# Patient Record
Sex: Female | Born: 1950
Health system: Southern US, Community
[De-identification: ages and names within clinical notes are randomized; demographics above are authoritative.]

## PROBLEM LIST (undated history)

## (undated) DIAGNOSIS — M810 Age-related osteoporosis without current pathological fracture: Secondary | ICD-10-CM

## (undated) DIAGNOSIS — I2510 Atherosclerotic heart disease of native coronary artery without angina pectoris: Secondary | ICD-10-CM

## (undated) DIAGNOSIS — F32A Depression, unspecified: Secondary | ICD-10-CM

## (undated) DIAGNOSIS — H269 Unspecified cataract: Secondary | ICD-10-CM

## (undated) DIAGNOSIS — F329 Major depressive disorder, single episode, unspecified: Secondary | ICD-10-CM

## (undated) DIAGNOSIS — T7840XA Allergy, unspecified, initial encounter: Secondary | ICD-10-CM

## (undated) DIAGNOSIS — F419 Anxiety disorder, unspecified: Secondary | ICD-10-CM

## (undated) DIAGNOSIS — Z9189 Other specified personal risk factors, not elsewhere classified: Principal | ICD-10-CM

## (undated) DIAGNOSIS — Z720 Tobacco use: Secondary | ICD-10-CM

## (undated) DIAGNOSIS — I700 Atherosclerosis of aorta: Secondary | ICD-10-CM

## (undated) DIAGNOSIS — J439 Emphysema, unspecified: Secondary | ICD-10-CM

## (undated) HISTORY — DX: Allergy, unspecified, initial encounter: T78.40XA

## (undated) HISTORY — DX: Unspecified cataract: H26.9

## (undated) HISTORY — DX: Anxiety disorder, unspecified: F41.9

## (undated) HISTORY — DX: Atherosclerosis of aorta: I70.0

## (undated) HISTORY — DX: Depression, unspecified: F32.A

## (undated) HISTORY — DX: Atherosclerotic heart disease of native coronary artery without angina pectoris: I25.10

## (undated) HISTORY — DX: Major depressive disorder, single episode, unspecified: F32.9

## (undated) HISTORY — DX: Emphysema, unspecified: J43.9

## (undated) HISTORY — DX: Age-related osteoporosis without current pathological fracture: M81.0

## (undated) HISTORY — DX: Tobacco use: Z72.0

## (undated) HISTORY — DX: Other specified personal risk factors, not elsewhere classified: Z91.89

## (undated) HISTORY — PX: OVARIAN CYST REMOVAL: SHX89

## (undated) HISTORY — PX: KNEE SURGERY: SHX244

## (undated) HISTORY — PX: TONSILLECTOMY: SUR1361

---

## 2002-01-26 ENCOUNTER — Other Ambulatory Visit: Admission: RE | Admit: 2002-01-26 | Discharge: 2002-01-26 | Payer: Self-pay | Admitting: *Deleted

## 2003-12-01 ENCOUNTER — Encounter: Admission: RE | Admit: 2003-12-01 | Discharge: 2003-12-01 | Payer: Self-pay | Admitting: Orthopaedic Surgery

## 2007-12-03 LAB — HM COLONOSCOPY

## 2016-07-08 ENCOUNTER — Telehealth: Payer: Self-pay

## 2016-07-08 NOTE — Telephone Encounter (Signed)
Pre visit call completed 

## 2016-07-10 ENCOUNTER — Encounter: Payer: Self-pay | Admitting: Family Medicine

## 2016-07-10 ENCOUNTER — Ambulatory Visit (INDEPENDENT_AMBULATORY_CARE_PROVIDER_SITE_OTHER): Payer: Medicare Other | Admitting: Family Medicine

## 2016-07-10 VITALS — BP 120/68 | HR 66 | Temp 98.2°F | Ht 62.75 in | Wt 127.8 lb

## 2016-07-10 DIAGNOSIS — F419 Anxiety disorder, unspecified: Secondary | ICD-10-CM | POA: Diagnosis not present

## 2016-07-10 DIAGNOSIS — Z1239 Encounter for other screening for malignant neoplasm of breast: Secondary | ICD-10-CM

## 2016-07-10 DIAGNOSIS — F17210 Nicotine dependence, cigarettes, uncomplicated: Secondary | ICD-10-CM

## 2016-07-10 DIAGNOSIS — Z1231 Encounter for screening mammogram for malignant neoplasm of breast: Secondary | ICD-10-CM

## 2016-07-10 DIAGNOSIS — R252 Cramp and spasm: Secondary | ICD-10-CM | POA: Diagnosis not present

## 2016-07-10 MED ORDER — LORAZEPAM 1 MG PO TABS: 1.0000 mg | ORAL_TABLET | Freq: Two times a day (BID) | ORAL | 5 refills | Status: DC

## 2016-07-10 NOTE — Patient Instructions (Signed)
Bring [place] your previous labs at your earliest convenience. Continue drinking lots of water and stretching.   Give [place] 2-3 business days to get the results of your labs back.   If you do not hear anything about your chest scan (lung cancer screening) and mammogram in the next 1-2 weeks, call our office and ask for an update.  If you find you are using the Ativan more than 10 days per month, I want you to schedule an appt.

## 2016-07-10 NOTE — Progress Notes (Signed)
Pre visit review using our clinic review tool, if applicable. No additional management support is needed unless otherwise documented below in the visit note. 

## 2016-07-10 NOTE — Progress Notes (Signed)
Chief Complaint  Patient presents with  . Establish Care    pt states she is having cramping in hands, feet,and lower legs       New Patient Visit SUBJECTIVE: HPI: Sharon Schmidt is an 66 y.o.female who is being seen for establishing care.  The patient was previously seen at only her OB/GYN's office.  Tetanus 1997ish PCV- Never had Shingrix-  Never; had bad reaction  Mammogram- 2 years ago Lung cancer screening- never  Cramping Around 1 yr, worsening since Dec of 2017. Affects b/l hands, bl feet and b/l calves equally. Happens every other day on average. She does stay well hydrated. Had electrolytes and Fe checked in the past and were all normal. She does stretch daily. Pt notes she is sore afterwards. It does not affect her joints. No numbness or tingling.     Anxiety Hx of anxiety. Treated with prn Ativan. Doesn't take every day, has a hx of MR and gets palpitations and panic attacks from it. She has anxious feelings as well. She has been on Paxil in the past but gained a lot of weight on it. She does not follow with a counselor or psychologist.  No Known Allergies  Past Medical History:  Diagnosis Date  . Allergy   . Anxiety   . Depression    Past Surgical History:  Procedure Laterality Date  . CESAREAN SECTION    . KNEE SURGERY     x2  . OVARIAN CYST REMOVAL    . TONSILLECTOMY     Social History   Social History  . Marital status: Married   Social History Main Topics  . Smoking status: Current Every Day Smoker    Types: Cigarettes    Start date: 07/10/1985  . Smokeless tobacco: Never Used  . Alcohol use Yes     Comment: Social  . Drug use: No   Family History  Problem Relation Age of Onset  . Diabetes Mother   . Hypertension Father   . Macular degeneration Father   . Vision loss Daughter      Current Outpatient Prescriptions:  .  ibuprofen (ADVIL,MOTRIN) 200 MG tablet, Take 200 mg by mouth at bedtime as needed., Disp: , Rfl:  .  LORazepam (ATIVAN) 1  MG tablet, Take 1 mg by mouth 2 (two) times daily. Prescribed by Dr. Manuela Schwartz GYN , Disp: , Rfl:  .  OVER THE COUNTER MEDICATION, Multivitamin Chewable-Take 1 chewable daily., Disp: , Rfl:  .  OVER THE COUNTER MEDICATION, Antiacid-Take 1 tablet by mouth as needed., Disp: , Rfl:   No LMP recorded. Patient is postmenopausal.  ROS Neuro: Denies numbness or tingling  MSK: +cramping   OBJECTIVE: BP 120/68 (BP Location: Left Arm, Patient Position: Sitting, Cuff Size: Normal)   Pulse 66   Temp 98.2 F (36.8 C) (Oral)   Ht 5' 2.75" (1.594 m)   Wt 127 lb 12.8 oz (58 kg)   SpO2 99%   BMI 22.82 kg/m   Constitutional: -  VS reviewed -  Well developed, well nourished, appears stated age -  No apparent distress  Psychiatric: -  Oriented to person, place, and time -  Memory intact -  Affect and mood normal -  Fluent conversation, good eye contact -  Judgment and insight age appropriate  Eye: -  Conjunctivae clear, no discharge -  Pupils symmetric, round, reactive to light  ENMT: -  Ears are patent b/l without erythema or discharge. TM's are shiny and clear b/l without  evidence of effusion or infection. -  Oral mucosa without lesions, tongue and uvula midline    Tonsils not enlarged, no erythema, no exudate, trachea midline    Pharynx moist, no lesions, no erythema  Neck: -  No gross swelling, no palpable masses -  Thyroid midline, not enlarged, mobile, no palpable masses  Cardiovascular: -  RRR, no murmurs -  No LE edema  Respiratory: -  Normal respiratory effort, no accessory muscle use, no retraction -  Breath sounds equal, no wheezes, no ronchi, no crackles  Gastrointestinal: -  Bowel sounds normal -  No tenderness, no distention, no guarding, no masses  Neurological:  -  CN II - XII grossly intact -  Sensation grossly intact to light touch, equal bilaterally  Musculoskeletal: -  No clubbing, no cyanosis -  Gait normal -  5/5 strength throughout -  +TTP over musculature in web  space between 1st and 2nd MC's b/l -  +b/l calf TTP, states this normally isn't present and she had a cramping episode this AM -  +TTP over the foot musculature on the plantar surface  Skin: -  No significant lesion on inspection -  Warm and dry to palpation   ASSESSMENT/PLAN: Cramping of feet - Plan: CBC, IBC panel, Ferritin, Comprehensive metabolic panel, Magnesium, Vitamin D (25 hydroxy), CANCELED: Comprehensive metabolic panel, CANCELED: Magnesium, CANCELED: HgB A1c  Cramping of hands - Plan: CBC, IBC panel, Ferritin, Comprehensive metabolic panel, Magnesium, Vitamin D (25 hydroxy), CANCELED: Comprehensive metabolic panel, CANCELED: Magnesium, CANCELED: HgB A1c  Screening for breast cancer - Plan: MM DIGITAL SCREENING BILATERAL  Smoking greater than 30 pack years - Plan: CT CHEST LUNG CANCER SCREENING LOW DOSE WO CONTRAST  Patient instructed to sign release of records form from her previous PCP. Tetanus today. Will come back for PCV and think about Shingrix.  Counseled on adequate hydration and continued stretching of hands/legs. Will recheck labs. If no improvement, will inform pt over phone and suggest Vit E 800 IU daily or Vit B complex. If this is unhelpful, will trial diltiazem vs gabapentin. Stated I do not ideally want her on just Ativan. She had a bad experience with Paxil in the past with weight gain. Would consider Prozac in the future. If she is using >10 times per mo, will have her return to discuss this option. Patient should return in 6 mo pending above. The patient voiced understanding and agreement to the plan.  Greater than 45 minutes were spent face to face with the patient with greater than 50% of this time spent counseling on preventative health services, immunizations, smoking cessation, cramping dx and workup.    [place], DO 07/10/16  2:39 PM

## 2016-07-11 LAB — COMPREHENSIVE METABOLIC PANEL
ALBUMIN: 4.3 g/dL (ref 3.5–5.2)
ALK PHOS: 52 U/L (ref 39–117)
ALT: 14 U/L (ref 0–35)
AST: 22 U/L (ref 0–37)
BILIRUBIN TOTAL: 0.4 mg/dL (ref 0.2–1.2)
BUN: 6 mg/dL (ref 6–23)
CO2: 26 mEq/L (ref 19–32)
Calcium: 9.1 mg/dL (ref 8.4–10.5)
Chloride: 102 mEq/L (ref 96–112)
Creatinine, Ser: 0.75 mg/dL (ref 0.40–1.20)
GFR: 82.2 mL/min (ref >60.00)
GLUCOSE: 80 mg/dL (ref 70–99)
POTASSIUM: 3.8 meq/L (ref 3.5–5.1)
Sodium: 135 mEq/L (ref 135–145)
TOTAL PROTEIN: 6.6 g/dL (ref 6.0–8.3)

## 2016-07-11 LAB — IBC PANEL
IRON: 102 ug/dL (ref 42–145)
Saturation Ratios: 34.9 % (ref 20.0–50.0)
Transferrin: 209 mg/dL — ABNORMAL LOW (ref 212.0–360.0)

## 2016-07-11 LAB — MAGNESIUM: MAGNESIUM: 2.2 mg/dL (ref 1.5–2.5)

## 2016-07-11 LAB — CBC
HEMATOCRIT: 38.8 % (ref 36.0–46.0)
HEMOGLOBIN: 13.2 g/dL (ref 12.0–15.0)
MCHC: 34.1 g/dL (ref 30.0–36.0)
MCV: 92.4 fl (ref 78.0–100.0)
Platelets: 326 10*3/uL (ref 150.0–400.0)
RBC: 4.2 Mil/uL (ref 3.87–5.11)
RDW: 14.4 % (ref 11.5–15.5)
WBC: 9.2 10*3/uL (ref 4.0–10.5)

## 2016-07-11 LAB — FERRITIN: FERRITIN: 135.8 ng/mL (ref 10.0–291.0)

## 2016-07-11 LAB — VITAMIN D 25 HYDROXY (VIT D DEFICIENCY, FRACTURES): VITD: 29.74 ng/mL — ABNORMAL LOW (ref 30.00–100.00)

## 2016-07-11 LAB — TSH: TSH: 1.5 u[IU]/mL (ref 0.35–4.50)

## 2016-07-15 ENCOUNTER — Ambulatory Visit (HOSPITAL_BASED_OUTPATIENT_CLINIC_OR_DEPARTMENT_OTHER)
Admission: RE | Admit: 2016-07-15 | Discharge: 2016-07-15 | Disposition: A | Discharge status: Home / Self Care | Payer: Medicare Other | Source: Ambulatory Visit | Attending: Family Medicine | Admitting: Family Medicine

## 2016-07-15 ENCOUNTER — Encounter (HOSPITAL_BASED_OUTPATIENT_CLINIC_OR_DEPARTMENT_OTHER): Payer: Self-pay

## 2016-07-15 ENCOUNTER — Other Ambulatory Visit: Payer: Self-pay | Admitting: Family Medicine

## 2016-07-15 ENCOUNTER — Telehealth: Payer: Self-pay | Admitting: *Deleted

## 2016-07-15 DIAGNOSIS — I2510 Atherosclerotic heart disease of native coronary artery without angina pectoris: Secondary | ICD-10-CM | POA: Diagnosis not present

## 2016-07-15 DIAGNOSIS — I700 Atherosclerosis of aorta: Secondary | ICD-10-CM | POA: Diagnosis not present

## 2016-07-15 DIAGNOSIS — J432 Centrilobular emphysema: Secondary | ICD-10-CM | POA: Insufficient documentation

## 2016-07-15 DIAGNOSIS — Z1231 Encounter for screening mammogram for malignant neoplasm of breast: Secondary | ICD-10-CM | POA: Insufficient documentation

## 2016-07-15 DIAGNOSIS — Z87891 Personal history of nicotine dependence: Secondary | ICD-10-CM | POA: Insufficient documentation

## 2016-07-15 DIAGNOSIS — F17210 Nicotine dependence, cigarettes, uncomplicated: Secondary | ICD-10-CM

## 2016-07-15 DIAGNOSIS — R918 Other nonspecific abnormal finding of lung field: Secondary | ICD-10-CM | POA: Insufficient documentation

## 2016-07-15 DIAGNOSIS — Z1239 Encounter for other screening for malignant neoplasm of breast: Secondary | ICD-10-CM

## 2016-07-15 DIAGNOSIS — I708 Atherosclerosis of other arteries: Secondary | ICD-10-CM

## 2016-07-15 DIAGNOSIS — I7090 Unspecified atherosclerosis: Secondary | ICD-10-CM

## 2016-07-15 NOTE — Telephone Encounter (Signed)
Called and spoke with the pt and informed her of recent CT chest results and note.  Pt verbalized understanding and agreed.  Pt was scheduled a lab appt for (Tues.07/16/16 @ 9:45am).  Future lab ordered and sent.//AB/CMA

## 2016-07-15 NOTE — Telephone Encounter (Signed)
-----   Message from Shelda Pal, DO sent at 07/15/2016  1:55 PM EDT ----- Let pt know that her lung CT showed nothing alarming and we will repeat in 1 year per guidelines. It did show some disease of her heart vessels. Let's add a lipid panel please. Please schedule also and ensure she is fasting. TY.

## 2016-07-16 ENCOUNTER — Other Ambulatory Visit (INDEPENDENT_AMBULATORY_CARE_PROVIDER_SITE_OTHER): Payer: Medicare Other

## 2016-07-16 DIAGNOSIS — I708 Atherosclerosis of other arteries: Secondary | ICD-10-CM

## 2016-07-16 DIAGNOSIS — I7090 Unspecified atherosclerosis: Secondary | ICD-10-CM

## 2016-07-16 LAB — LIPID PANEL
CHOLESTEROL: 202 mg/dL — AB (ref 0–200)
HDL: 87.1 mg/dL (ref >39.00)
LDL Cholesterol: 96 mg/dL (ref 0–99)
NonHDL: 115.33
TRIGLYCERIDES: 95 mg/dL (ref 0.0–149.0)
Total CHOL/HDL Ratio: 2
VLDL: 19 mg/dL (ref 0.0–40.0)

## 2016-08-05 ENCOUNTER — Ambulatory Visit: Payer: Medicare Other | Admitting: Family Medicine

## 2016-08-08 ENCOUNTER — Encounter: Payer: Self-pay | Admitting: Family Medicine

## 2016-08-08 ENCOUNTER — Ambulatory Visit (INDEPENDENT_AMBULATORY_CARE_PROVIDER_SITE_OTHER): Payer: Medicare Other | Admitting: Family Medicine

## 2016-08-08 VITALS — BP 128/74 | HR 65 | Temp 98.1°F | Ht 62.75 in | Wt 130.4 lb

## 2016-08-08 DIAGNOSIS — L821 Other seborrheic keratosis: Secondary | ICD-10-CM | POA: Diagnosis not present

## 2016-08-08 DIAGNOSIS — D489 Neoplasm of uncertain behavior, unspecified: Secondary | ICD-10-CM

## 2016-08-08 DIAGNOSIS — Z9189 Other specified personal risk factors, not elsewhere classified: Secondary | ICD-10-CM

## 2016-08-08 DIAGNOSIS — I2510 Atherosclerotic heart disease of native coronary artery without angina pectoris: Secondary | ICD-10-CM | POA: Diagnosis not present

## 2016-08-08 DIAGNOSIS — Z720 Tobacco use: Secondary | ICD-10-CM

## 2016-08-08 HISTORY — DX: Tobacco use: Z72.0

## 2016-08-08 HISTORY — DX: Atherosclerotic heart disease of native coronary artery without angina pectoris: I25.10

## 2016-08-08 HISTORY — DX: Other specified personal risk factors, not elsewhere classified: Z91.89

## 2016-08-08 MED ORDER — PRAVASTATIN SODIUM 10 MG PO TABS: 10.0000 mg | ORAL_TABLET | Freq: Every day | ORAL | 5 refills | Status: DC

## 2016-08-08 NOTE — Patient Instructions (Signed)
Stay well hydrated.  Do not shower for the rest of the day. When you do wash it, use only soap and water. Do not vigorously scrub. Apply triple antibiotic ointment (like Neosporin) twice daily. Keep the area clean and dry.   Things to look out for: increasing pain not relieved by ibuprofen/acetaminophen, fevers, spreading redness, drainage of pus, or foul odor.

## 2016-08-08 NOTE — Progress Notes (Signed)
Chief Complaint  Patient presents with  . Follow-up    to dicuss recent lab results and chest scan  . Nevus    on (R) lower leg-changing    Subjective: Patient is a 66 y.o. female here for f/u CT/cholesterol.  Atherosclerosis seen in coronary vessels and aorta. 10 years CVD is 7.5%. She has cut down on her smoking, however is still active. She is cutting down on carbs as well. She is trying to stay physically active as well.  The patient has also noticed a lesion on her leg that is getting bigger and borders are becoming more irregular. No pain or injury. It does not itch. No noticeable skin color changes.  ROS: Heart: Denies chest pain  Lungs: Denies SOB   Family History  Problem Relation Age of Onset  . Diabetes Mother   . Hypertension Father   . Macular degeneration Father   . Vision loss Daughter    Past Medical History:  Diagnosis Date  . 10 year risk of MI or stroke 7.5% or greater 08/08/2016  . Allergy   . Anxiety   . Coronary atherosclerosis 08/08/2016  . Depression   . Tobacco abuse 08/08/2016   No Known Allergies  Current Outpatient Prescriptions:  .  Cholecalciferol (VITAMIN D PO), Take by mouth. Take 1 gel cap by mouth daily., Disp: , Rfl:  .  ibuprofen (ADVIL,MOTRIN) 200 MG tablet, Take 200 mg by mouth at bedtime as needed., Disp: , Rfl:  .  LORazepam (ATIVAN) 1 MG tablet, Take 1 tablet (1 mg total) by mouth 2 (two) times daily. Prescribed by Dr. Manuela Schwartz GYN, Disp: 60 tablet, Rfl: 5 .  OVER THE COUNTER MEDICATION, Multivitamin Chewable-Take 1 chewable daily., Disp: , Rfl:  .  OVER THE COUNTER MEDICATION, Antiacid-Take 1 tablet by mouth as needed., Disp: , Rfl:  .  vitamin E 400 UNIT capsule, Take 800 Units by mouth daily., Disp: , Rfl:  .  pravastatin (PRAVACHOL) 10 MG tablet, Take 1 tablet (10 mg total) by mouth daily., Disp: 30 tablet, Rfl: 5  Objective: BP 128/74 (BP Location: Left Arm, Patient Position: Sitting, Cuff Size: Normal)   Pulse 65   Temp 98.1 F  (36.7 C) (Oral)   Ht 5' 2.75" (1.594 m)   Wt 130 lb 6.4 oz (59.1 kg)   SpO2 98%   BMI 23.28 kg/m  General: Awake, appears stated age HEENT: MMM, EOMi Heart: RRR, no murmurs Lungs: CTAB, no rales, wheezes or rhonchi. No accessory muscle use Skin: 0.8 cmx0.9 cm hyperpigmented and raised lesion on R anterior LE, no erythema, TTP, or fluctuance Psych: Age appropriate judgment and insight, normal affect and mood  Procedure note; shave biopsy Informed consent was obtained. The area was cleaned with alcohol and injected with 1.5 mL of 1% lidocaine with epinephrine. A Dermablade was slightly bent and used to cut under the area of interest. The specimen was placed in a sterile specimen cup and sent to the lab. Adequate hemostasis attained. The area was dressed with triple antibiotic ointment and a bandage. There were no complications noted. The patient tolerated the procedure well.   Assessment and Plan: 10 year risk of MI or stroke 7.5% or greater - Plan: pravastatin (PRAVACHOL) 10 MG tablet  Atherosclerosis of coronary artery of native heart without angina pectoris, unspecified vessel or lesion type - Plan: pravastatin (PRAVACHOL) 10 MG tablet  Tobacco abuse  Neoplasm of uncertain behavior - Plan: PR SHAV SKIN LES 0.6-1.0 CM TRUNK,ARM,LEG, Surgical pathology  Orders as above. Start statin. Low dose, counseled on diet and exercise. No need for ASA. Counseled on tobacco cessation. Removed lesion of interest given border irreg and change in size. F/u in 3 mo to recheck cholesterol. The patient voiced understanding and agreement to the plan.  [place], DO 08/08/16  12:44 PM

## 2016-11-08 ENCOUNTER — Encounter: Payer: Self-pay | Admitting: Family Medicine

## 2016-11-08 ENCOUNTER — Ambulatory Visit: Payer: Medicare Other | Admitting: Family Medicine

## 2016-11-08 ENCOUNTER — Ambulatory Visit (INDEPENDENT_AMBULATORY_CARE_PROVIDER_SITE_OTHER): Payer: Medicare Other | Admitting: Family Medicine

## 2016-11-08 VITALS — BP 122/84 | HR 62 | Temp 98.4°F | Ht 63.0 in | Wt 130.1 lb

## 2016-11-08 DIAGNOSIS — E559 Vitamin D deficiency, unspecified: Secondary | ICD-10-CM | POA: Diagnosis not present

## 2016-11-08 DIAGNOSIS — Z9189 Other specified personal risk factors, not elsewhere classified: Secondary | ICD-10-CM | POA: Diagnosis not present

## 2016-11-08 DIAGNOSIS — Z23 Encounter for immunization: Secondary | ICD-10-CM

## 2016-11-08 DIAGNOSIS — F419 Anxiety disorder, unspecified: Secondary | ICD-10-CM

## 2016-11-08 DIAGNOSIS — Z720 Tobacco use: Secondary | ICD-10-CM

## 2016-11-08 LAB — VITAMIN D 25 HYDROXY (VIT D DEFICIENCY, FRACTURES): VITD: 42.37 ng/mL (ref 30.00–100.00)

## 2016-11-08 MED ORDER — CLONAZEPAM 1 MG PO TABS: 0.5000 mg | ORAL_TABLET | Freq: Two times a day (BID) | ORAL | 1 refills | Status: DC

## 2016-11-08 NOTE — Patient Instructions (Addendum)
Try CoQ10.  Try to use the new medicine only as needed.   Keep up the great work when your smoking cessation efforts.  Let us know if you need anything.

## 2016-11-08 NOTE — Addendum Note (Signed)
Addended by: Mirca Seller B on: 11/08/2016 10:22 AM   Modules accepted: Orders

## 2016-11-08 NOTE — Progress Notes (Signed)
Chief Complaint  Patient presents with  . Follow-up    Subjective: Dyslipidemia Patient presents for dyslipidemia follow up. Started on pravastatin She confirms myalgias. She is adhering to a low sodium and low fat diet. The patient exercises never. She has cut down to 10 cigarettes daily from 15.   We discussed anxiety as well. She continues to use Ativan daily. She has been on Paxil in the past and had a port spirits with her as it made her gain a lot of weight. She does not follow a counselor or psychologist. She is willing to come off of this medicine.  ROS: Heart: Denies chest pain Lungs: Denies SOB  Family History  Problem Relation Age of Onset  . Diabetes Mother   . Hypertension Father   . Macular degeneration Father   . Vision loss Daughter    Past Medical History:  Diagnosis Date  . 10 year risk of MI or stroke 7.5% or greater 08/08/2016  . Allergy   . Anxiety   . Coronary atherosclerosis 08/08/2016  . Depression   . Tobacco abuse 08/08/2016   No Known Allergies  Current Outpatient Prescriptions:  .  Cholecalciferol (VITAMIN D PO), Take by mouth. Take 1 gel cap by mouth daily., Disp: , Rfl:  .  ibuprofen (ADVIL,MOTRIN) 200 MG tablet, Take 200 mg by mouth at bedtime as needed., Disp: , Rfl:  .  OVER THE COUNTER MEDICATION, Multivitamin Chewable-Take 1 chewable daily., Disp: , Rfl:  .  OVER THE COUNTER MEDICATION, Antiacid-Take 1 tablet by mouth as needed., Disp: , Rfl:  .  vitamin E 400 UNIT capsule, Take 800 Units by mouth daily., Disp: , Rfl:  .  clonazePAM (KLONOPIN) 1 MG tablet, Take 0.5-1 tablets (0.5-1 mg total) by mouth 2 (two) times daily., Disp: 60 tablet, Rfl: 1  Objective: BP 122/84 (BP Location: Left Arm, Patient Position: Sitting, Cuff Size: Normal)   Pulse 62   Temp 98.4 F (36.9 C) (Oral)   Ht 5\' 3"  (1.6 m)   Wt 130 lb 2 oz (59 kg)   SpO2 97%   BMI 23.05 kg/m  General: Awake, appears stated age HEENT: MMM, EOMi  Heart: RRR, no bruits Lungs:  CTAB, no rales, wheezes or rhonchi. No accessory muscle use Psych: Age appropriate judgment and insight, normal affect and mood  Assessment and Plan: 10 year risk of MI or stroke 7.5% or greater  Tobacco abuse  Anxiety - Plan: clonazePAM (KLONOPIN) 1 MG tablet  Vitamin D insufficiency - Plan: Vitamin D (25 hydroxy)  Orders as above. She was praised for tobacco cessation efforts. Discussed starting new medication vs lifestyle changes and smoking cessation (smoking cessation would put her below 7.5%). Opted for the latter given intolerance.  Counseled on diet and exercise. Stop Ativan. Start Klonopin, 0.5-1 tabs twice daily as needed. I will see her again in the middle of October. She will get her flu shot at that time. If she is still using this frequently, I will discuss starting a daily medication. I am pleased that she is willing to wean down. The patient voiced understanding and agreement to the plan.  [place], DO 11/08/16  10:08 AM

## 2016-11-11 ENCOUNTER — Telehealth: Payer: Self-pay

## 2016-11-11 NOTE — Telephone Encounter (Signed)
PA approved effective from 11/11/2016 through 11/11/2017.

## 2016-11-11 NOTE — Telephone Encounter (Signed)
PA initiated via Covermymeds; KEY: [id number]. Awaiting determination.

## 2016-12-17 ENCOUNTER — Emergency Department (HOSPITAL_BASED_OUTPATIENT_CLINIC_OR_DEPARTMENT_OTHER): Payer: Medicare Other

## 2016-12-17 ENCOUNTER — Encounter (HOSPITAL_BASED_OUTPATIENT_CLINIC_OR_DEPARTMENT_OTHER): Payer: Self-pay | Admitting: Emergency Medicine

## 2016-12-17 ENCOUNTER — Emergency Department (HOSPITAL_BASED_OUTPATIENT_CLINIC_OR_DEPARTMENT_OTHER)
Admission: EM | Admit: 2016-12-17 | Discharge: 2016-12-17 | Disposition: A | Discharge status: Home / Self Care | Payer: Medicare Other | Attending: Emergency Medicine | Admitting: Emergency Medicine

## 2016-12-17 DIAGNOSIS — Y92093 Driveway of other non-institutional residence as the place of occurrence of the external cause: Secondary | ICD-10-CM | POA: Insufficient documentation

## 2016-12-17 DIAGNOSIS — Y9389 Activity, other specified: Secondary | ICD-10-CM | POA: Diagnosis not present

## 2016-12-17 DIAGNOSIS — S66912A Strain of unspecified muscle, fascia and tendon at wrist and hand level, left hand, initial encounter: Secondary | ICD-10-CM

## 2016-12-17 DIAGNOSIS — Y998 Other external cause status: Secondary | ICD-10-CM | POA: Insufficient documentation

## 2016-12-17 DIAGNOSIS — Z79899 Other long term (current) drug therapy: Secondary | ICD-10-CM | POA: Insufficient documentation

## 2016-12-17 DIAGNOSIS — I2510 Atherosclerotic heart disease of native coronary artery without angina pectoris: Secondary | ICD-10-CM | POA: Insufficient documentation

## 2016-12-17 DIAGNOSIS — F329 Major depressive disorder, single episode, unspecified: Secondary | ICD-10-CM | POA: Diagnosis not present

## 2016-12-17 DIAGNOSIS — F419 Anxiety disorder, unspecified: Secondary | ICD-10-CM | POA: Diagnosis not present

## 2016-12-17 DIAGNOSIS — W010XXA Fall on same level from slipping, tripping and stumbling without subsequent striking against object, initial encounter: Secondary | ICD-10-CM | POA: Diagnosis not present

## 2016-12-17 DIAGNOSIS — S6992XA Unspecified injury of left wrist, hand and finger(s), initial encounter: Secondary | ICD-10-CM | POA: Diagnosis present

## 2016-12-17 DIAGNOSIS — F17210 Nicotine dependence, cigarettes, uncomplicated: Secondary | ICD-10-CM | POA: Insufficient documentation

## 2016-12-17 MED ORDER — IBUPROFEN 800 MG PO TABS
800.0000 mg | ORAL_TABLET | Freq: Once | ORAL | Status: AC
  Administered 2016-12-17: 800 mg via ORAL
  Filled 2016-12-17: qty 1

## 2016-12-17 MED ORDER — ACETAMINOPHEN 500 MG PO TABS
1000.0000 mg | ORAL_TABLET | Freq: Once | ORAL | Status: AC
  Administered 2016-12-17: 1000 mg via ORAL
  Filled 2016-12-17: qty 2

## 2016-12-17 MED ORDER — TRAMADOL HCL 50 MG PO TABS: 50.0000 mg | ORAL_TABLET | Freq: Four times a day (QID) | ORAL | 0 refills | Status: DC | PRN

## 2016-12-17 MED FILL — traMADol HCL 50 MG TABS: 50 | 3 days supply | Qty: 5 | Fill #0

## 2016-12-17 NOTE — ED Provider Notes (Signed)
[facility] DEPART[facility] Provider Note   CSN: [id number] Arrival date & time: 12/17/16  7341     History   Chief Complaint Chief Complaint  Patient presents with  . Arm Pain    HPI Sharon Schmidt is a 66 y.o. female.  66 yo F with a chief complaint of left wrist pain. This happened after the patient tripped while walking in her driveway. Landed on an outstretched hand. Denies head injury loss of consciousness. Denies neck pain back pain chest pain abdominal pain. Complaining of pain to the radial aspect of her left wrist. Worse with movement twisting palpation. She had a small abrasion to the left lateral aspect of the hand which she applied bacitracin to. She had a wrist splint at home that she applied as well. Has been icing it and elevating it with significant improvement. He continues to her today and so she came for an x-ray.   The history is provided by the patient.  Arm Pain  This is a new problem. The current episode started yesterday. The problem occurs constantly. The problem has been gradually worsening. Pertinent negatives include no chest pain, no headaches and no shortness of breath. The symptoms are aggravated by bending and twisting. Nothing relieves the symptoms. She has tried nothing for the symptoms. The treatment provided no relief.    Past Medical History:  Diagnosis Date  . 10 year risk of MI or stroke 7.5% or greater 08/08/2016  . Allergy   . Anxiety   . Coronary atherosclerosis 08/08/2016  . Depression   . Tobacco abuse 08/08/2016    Patient Active Problem List   Diagnosis Date Noted  . Tobacco abuse 08/08/2016  . Coronary atherosclerosis 08/08/2016  . 10 year risk of MI or stroke 7.5% or greater 08/08/2016    Past Surgical History:  Procedure Laterality Date  . CESAREAN SECTION    . KNEE SURGERY     x2  . OVARIAN CYST REMOVAL    . TONSILLECTOMY      OB History    No data available       Home Medications    Prior to  Admission medications   Medication Sig Start Date End Date Taking? Authorizing Provider  Cholecalciferol (VITAMIN D PO) Take by mouth. Take 1 gel cap by mouth daily.    [provider]  clonazePAM (KLONOPIN) 1 MG tablet Take 0.5-1 tablets (0.5-1 mg total) by mouth 2 (two) times daily. 11/08/16   Shelda Pal, DO  ibuprofen (ADVIL,MOTRIN) 200 MG tablet Take 200 mg by mouth at bedtime as needed.    [provider]  OVER THE COUNTER MEDICATION Multivitamin Chewable-Take 1 chewable daily.    [provider]  OVER THE COUNTER MEDICATION Antiacid-Take 1 tablet by mouth as needed.    [provider]  traMADol (ULTRAM) 50 MG tablet Take 1 tablet (50 mg total) by mouth every 6 (six) hours as needed. 12/17/16   Deno Etienne, DO  vitamin E 400 UNIT capsule Take 800 Units by mouth daily.    [provider]    Family History Family History  Problem Relation Age of Onset  . Diabetes Mother   . Hypertension Father   . Macular degeneration Father   . Vision loss Daughter     Social History Social History  Substance Use Topics  . Smoking status: Current Every Day Smoker    Packs/day: 0.75    Years: 46.00    Types: Cigarettes  Start date: 07/10/1985  . Smokeless tobacco: Never Used  . Alcohol use Yes     Comment: Social     Allergies   Patient has no known allergies.   Review of Systems Review of Systems  Constitutional: Negative for chills and fever.  HENT: Negative for congestion and rhinorrhea.   Eyes: Negative for redness and visual disturbance.  Respiratory: Negative for shortness of breath and wheezing.   Cardiovascular: Negative for chest pain and palpitations.  Gastrointestinal: Negative for nausea and vomiting.  Genitourinary: Negative for dysuria and urgency.  Musculoskeletal: Positive for arthralgias and joint swelling. Negative for myalgias.  Skin: Negative for pallor and wound.  Neurological: Negative for dizziness and  headaches.     Physical Exam Updated Vital Signs BP (!) 142/93   Pulse 81   Temp 98.1 F (36.7 C) (Oral)   Resp 18   Ht 5\' 3"  (1.6 m)   Wt 56.7 kg (125 lb)   SpO2 100%   BMI 22.14 kg/m   Physical Exam  Constitutional: She is oriented to person, place, and time. She appears well-developed and well-nourished. No distress.  HENT:  Head: Normocephalic and atraumatic.  Eyes: Pupils are equal, round, and reactive to light. EOM are normal.  Neck: Normal range of motion. Neck supple.  Cardiovascular: Normal rate and regular rhythm.  Exam reveals no gallop and no friction rub.   No murmur heard. Pulmonary/Chest: Effort normal. She has no wheezes. She has no rales.  Abdominal: Soft. She exhibits no distension and no mass. There is no tenderness. There is no guarding.  Musculoskeletal: She exhibits deformity (pain to the distal left radius.  No snuffbox ttp.  PMS intact distally). She exhibits no edema or tenderness.  Neurological: She is alert and oriented to person, place, and time.  Skin: Skin is warm and dry. She is not diaphoretic.  Psychiatric: She has a normal mood and affect. Her behavior is normal.  Nursing note and vitals reviewed.    ED Treatments / Results  Labs (all labs ordered are listed, but only abnormal results are displayed) Labs Reviewed - No data to display  EKG  EKG Interpretation None       Radiology Dg Wrist Complete Left  Result Date: 12/17/2016 CLINICAL DATA:  Recent fall with wrist pain, initial encounter EXAM: LEFT WRIST - COMPLETE 3+ VIEW COMPARISON:  None. FINDINGS: There is no evidence of fracture or dislocation. There is no evidence of arthropathy or other focal bone abnormality. Soft tissues are unremarkable. IMPRESSION: No acute abnormality noted. Electronically Signed   By: Inez Catalina M.D.   On: 12/17/2016 09:50    Procedures Procedures (including critical care time)  Medications Ordered in ED Medications  acetaminophen (TYLENOL)  tablet 1,000 mg (1,000 mg Oral Given 12/17/16 0954)  ibuprofen (ADVIL,MOTRIN) tablet 800 mg (800 mg Oral Given 12/17/16 0954)     Initial Impression / Assessment and Plan / ED Course  I have reviewed the triage vital signs and the nursing notes.  Pertinent labs & imaging results that were available during my care of the patient were reviewed by me and considered in my medical decision making (see chart for details).     66 yo F with a chief complaint of left wrist pain. This occurred after a fall on an outstretched hand. Mechanical fall. No other noted areas of bony tenderness. Will obtain a plain film of the left wrist. The patient is adamant that tramadol is the only medication that works for her.  9:56 AM:  I have discussed the diagnosis/risks/treatment options with the patient and family and believe the pt to be eligible for discharge home to follow-up with PCP. We also discussed returning to the ED immediately if new or worsening sx occur. We discussed the sx which are most concerning (e.g., sudden worsening pain, fever, inability to tolerate by mouth) that necessitate immediate return. Medications administered to the patient during their visit and any new prescriptions provided to the patient are listed below.  Medications given during this visit Medications  acetaminophen (TYLENOL) tablet 1,000 mg (1,000 mg Oral Given 12/17/16 0954)  ibuprofen (ADVIL,MOTRIN) tablet 800 mg (800 mg Oral Given 12/17/16 0954)     The patient appears reasonably screen and/or stabilized for discharge and I doubt any other medical condition or other [facility] requiring further screening, evaluation, or treatment in the ED at this time prior to discharge.    Final Clinical Impressions(s) / ED Diagnoses   Final diagnoses:  Wrist strain, left, initial encounter    New Prescriptions New Prescriptions   TRAMADOL (ULTRAM) 50 MG TABLET    Take 1 tablet (50 mg total) by mouth every 6 (six) hours as needed.       Deno Etienne, DO 12/17/16 [phone]

## 2016-12-17 NOTE — ED Triage Notes (Signed)
Pt c/o LUE pain s/p fall in driveway yesterday

## 2016-12-17 NOTE — Discharge Instructions (Signed)
Take 2 over the counter ibuprofen tablets 3 times a day or 1 over-the-counter naproxen tablets twice a day for pain. °Also take tylenol 1000mg(2 extra strength) four times a day.  ° °Then take the pain medicine if you feel like you need it. Narcotics do not help with the pain, they only make you care about it less.  You can become addicted to this, people may break into your house to steal it.  It will constipate you.  If you drive under the influence of this medicine you can get a DUI.   ° °

## 2016-12-20 ENCOUNTER — Ambulatory Visit (INDEPENDENT_AMBULATORY_CARE_PROVIDER_SITE_OTHER): Payer: Medicare Other | Admitting: Family Medicine

## 2016-12-20 VITALS — BP 144/92 | HR 64 | Temp 98.4°F | Ht 63.0 in | Wt 129.4 lb

## 2016-12-20 DIAGNOSIS — F419 Anxiety disorder, unspecified: Secondary | ICD-10-CM

## 2016-12-20 DIAGNOSIS — S6992XA Unspecified injury of left wrist, hand and finger(s), initial encounter: Secondary | ICD-10-CM

## 2016-12-20 DIAGNOSIS — Z23 Encounter for immunization: Secondary | ICD-10-CM

## 2016-12-20 NOTE — Progress Notes (Signed)
Pre visit review using our clinic tool,if applicable. No additional management support is needed unless otherwise documented below in the visit note.  

## 2016-12-20 NOTE — Progress Notes (Signed)
Chief Complaint  Patient presents with  . Follow-up    Anxiety    Subjective Tonnette Zwiebel presents for f/u anxiety/depression.  She is currently being treated with Klonopin  Reports having it make her feel drunk since treatemnt. Has failed Paxil and Xanax. Reports healthy diet overall. Reports getting routine exercise. No thoughts of harming self or others. No self-medication with alcohol, prescription drugs or illicit drugs. She is not following with a counselor/psychologist. Stressful with mother just going into hospice and recently had a wrist injury. Went back to Ativan, but she is OK considering coming off it, just after things calm down in her life.   Had a fall 3 days ago, XR neg. Wearing a wrist brace now. Still having some pain, tramadol for pain helping, but making her sick to her stomach. No numbness or tingling.   ROS Psych: No homicidal or suicidal thoughts MSK: +wrist pain  Past Medical History:  Diagnosis Date  . 10 year risk of MI or stroke 7.5% or greater 08/08/2016  . Allergy   . Anxiety   . Coronary atherosclerosis 08/08/2016  . Depression   . Tobacco abuse 08/08/2016   Family History  Problem Relation Age of Onset  . Diabetes Mother   . Hypertension Father   . Macular degeneration Father   . Vision loss Daughter    Allergies as of 12/20/2016   No Known Allergies     Medication List       Accurate as of 12/20/16 12:33 PM. Always use your most recent med list.          ibuprofen 200 MG tablet Commonly known as:  ADVIL,MOTRIN Take 200 mg by mouth at bedtime as needed.   OVER THE COUNTER MEDICATION Multivitamin Chewable-Take 1 chewable daily.   OVER THE COUNTER MEDICATION Antiacid-Take 1 tablet by mouth as needed.   traMADol 50 MG tablet Commonly known as:  ULTRAM Take 1 tablet (50 mg total) by mouth every 6 (six) hours as needed.   VITAMIN D PO Take by mouth. Take 1 gel cap by mouth daily.   vitamin E 400 UNIT capsule Take 800 Units  by mouth daily.       Exam BP (!) 144/92   Pulse 64   Temp 98.4 F (36.9 C) (Oral)   Ht 5\' 3"  (1.6 m)   Wt 129 lb 6.4 oz (58.7 kg)   SpO2 100%   BMI 22.92 kg/m  General:  well developed, well nourished, in no apparent distress Neck: neck supple without adenopathy, thyromegaly, or masses Lungs:  clear to auscultation, breath sounds equal bilaterally, no respiratory distress Cardio:  regular rate and rhythm without murmurs, heart sounds without clicks or rubs Psych: well oriented with normal range of affect and age-appropriate judgement/insight, alert and oriented x4.  Assessment and Plan  Anxiety  Need for influenza vaccination - Plan: Flu vaccine HIGH DOSE PF (Fluzone High dose)  DC klonopin, go back to ativan for now. Readdress at f/u.  Let us know by [facility] if no improvement, will get XR, consider MRI to see if bone edema present. Counseled on diet and exercise affecting mood. Stop smoking. F/u in 2 mo. The patient voiced understanding and agreement to the plan.  Giles, DO 12/20/16 12:33 PM

## 2016-12-20 NOTE — Patient Instructions (Signed)
Let me know if you are still having pain on Monday, we will order an X-ray.   Let us know if you need anything.

## 2017-01-09 ENCOUNTER — Ambulatory Visit: Payer: Medicare Other | Admitting: Family Medicine

## 2017-01-10 ENCOUNTER — Other Ambulatory Visit: Payer: Self-pay | Admitting: Family Medicine

## 2017-01-10 ENCOUNTER — Telehealth: Payer: Self-pay | Admitting: Family Medicine

## 2017-01-10 DIAGNOSIS — F419 Anxiety disorder, unspecified: Secondary | ICD-10-CM

## 2017-01-10 MED ORDER — LORAZEPAM 1 MG PO TABS: 1.0000 mg | ORAL_TABLET | Freq: Two times a day (BID) | ORAL | 5 refills | Status: DC

## 2017-01-10 NOTE — Telephone Encounter (Signed)
Yes the prescription requested is lorazepam--if ok to fill let me know

## 2017-01-10 NOTE — Addendum Note (Signed)
Addended by: Charly Seller B on: 01/10/2017 02:26 PM   Modules accepted: Orders

## 2017-01-10 NOTE — Telephone Encounter (Signed)
Lorazepam, not Klonopin. Ty.

## 2017-01-10 NOTE — Telephone Encounter (Signed)
OK to fill. TY.

## 2017-01-10 NOTE — Telephone Encounter (Signed)
Will fax to [facility]

## 2017-01-10 NOTE — Telephone Encounter (Signed)
Patient requested to pharmacy refill on Lorazepam last refilled on 07/10/2016  #60 with 5 refills. Was prescribed clonazepam on 11/08/2016  #60 with 1 refill, but pharmacist said she never got a refill on that. Advise if ok to fill the lorazepam.  Pharmacist thinks maybe the clonazepam did not work for her since she never picked up the refill.

## 2017-02-12 ENCOUNTER — Ambulatory Visit: Payer: Medicare Other | Admitting: Family Medicine

## 2017-03-12 ENCOUNTER — Ambulatory Visit (INDEPENDENT_AMBULATORY_CARE_PROVIDER_SITE_OTHER): Payer: Medicare HMO | Admitting: Family Medicine

## 2017-03-12 ENCOUNTER — Encounter: Payer: Self-pay | Admitting: Family Medicine

## 2017-03-12 VITALS — BP 124/80 | HR 62 | Temp 98.5°F | Ht 63.0 in | Wt 131.2 lb

## 2017-03-12 DIAGNOSIS — H6591 Unspecified nonsuppurative otitis media, right ear: Secondary | ICD-10-CM | POA: Diagnosis not present

## 2017-03-12 DIAGNOSIS — Z23 Encounter for immunization: Secondary | ICD-10-CM

## 2017-03-12 MED ORDER — PREDNISONE 20 MG PO TABS: 40.0000 mg | ORAL_TABLET | Freq: Every day | ORAL | 0 refills | Status: AC

## 2017-03-12 NOTE — Progress Notes (Signed)
Chief Complaint  Patient presents with  . Follow-up    Pt is here for right ear pain. Duration: 2 days Progression: unchanged Associated symptoms: None Denies: fevers, uri symptoms, injury, bleeding, or discharge from ear Treatment to date: None  ROS:  HEENT: +ear pain Costitutional: Denies fevers  Past Medical History:  Diagnosis Date  . 10 year risk of MI or stroke 7.5% or greater 08/08/2016  . Allergy   . Anxiety   . Coronary atherosclerosis 08/08/2016  . Depression   . Tobacco abuse 08/08/2016   Family History  Problem Relation Age of Onset  . Diabetes Mother   . Hypertension Father   . Macular degeneration Father   . Vision loss Daughter    Past Surgical History:  Procedure Laterality Date  . CESAREAN SECTION    . KNEE SURGERY     x2  . OVARIAN CYST REMOVAL    . TONSILLECTOMY      BP 124/80 (BP Location: Left Arm, Patient Position: Sitting, Cuff Size: Normal)   Pulse 62   Temp 98.5 F (36.9 C) (Oral)   Ht 5\' 3"  (1.6 m)   Wt 131 lb 4 oz (59.5 kg)   PF 98 L/min   BMI 23.25 kg/m  General: Awake, alert, appearing stated age HEENT:  L ear- Canal patent without drainage or erythema, TM is neg R ear- canal patent without drainage or erythema, TM is bulging, +serous fluid, no erythema Nose- nares patent and without discharge Mouth- Lips, gums and dentition unremarkable, pharynx is without erythema or exudate Neck: No adenopathy Lungs: Normal effort, no accessory muscle use Psych: Age appropriate judgment and insight, normal mood and affect  Fluid level behind tympanic membrane of right ear - Plan: predniSONE (DELTASONE) 20 MG tablet  Orders as above. Shingrix today F/u in 6 mo for CPE or prn. Pt voiced understanding and agreement to the plan.  [place], DO 03/12/17 10:55 AM

## 2017-03-12 NOTE — Progress Notes (Signed)
Pre visit review using our clinic review tool, if applicable. No additional management support is needed unless otherwise documented below in the visit note. 

## 2017-03-12 NOTE — Patient Instructions (Addendum)
Things to look out for: fevers, drainage, bleeding, worsening symptoms. '  Try a spoonful of pickle juice before bed.   Let us know if you need anything.

## 2017-03-12 NOTE — Addendum Note (Signed)
Addended by: Olean Seller B on: 03/12/2017 11:26 AM   Modules accepted: Orders

## 2017-03-19 ENCOUNTER — Ambulatory Visit: Payer: Self-pay

## 2017-03-19 ENCOUNTER — Encounter: Payer: Self-pay | Admitting: Family Medicine

## 2017-03-19 ENCOUNTER — Ambulatory Visit (INDEPENDENT_AMBULATORY_CARE_PROVIDER_SITE_OTHER): Payer: Medicare HMO | Admitting: Family Medicine

## 2017-03-19 VITALS — BP 110/70 | HR 69 | Temp 97.9°F | Ht 63.0 in | Wt 135.0 lb

## 2017-03-19 DIAGNOSIS — H9201 Otalgia, right ear: Secondary | ICD-10-CM

## 2017-03-19 NOTE — Telephone Encounter (Signed)
Called the patient and informed her of PCP's instructions. She will come over shortly to have PCP take a look

## 2017-03-19 NOTE — Patient Instructions (Addendum)
We are going to try to get you into the ENT doctor's office in the next couple days. We will reach out to you.  Continue ibuprofen and Tramadol if needed. Tylenol may also be helpful.   Let us know if you need anything.

## 2017-03-19 NOTE — Progress Notes (Signed)
Pre visit review using our clinic review tool, if applicable. No additional management support is needed unless otherwise documented below in the visit note. 

## 2017-03-19 NOTE — Telephone Encounter (Signed)
Pt. called to report her right ear pain is worse.  Stated she woke up this morning with an exacerbation of the pain.  Stated the ear pain is "constant."  Reported the ear has opened-up, and she can hear better.  Denied fever.  Denied any drainage from the ear.  Advised that I will make Dr. Nani Ravens aware of her increased pain.        Answer Assessment - Initial Assessment Questions 1. REASON FOR CALL or QUESTION: "What is your reason for calling today?" or "How can I best help you?" or "What question do you have that I can help answer?"     Calling to request refill on Prednisone or another medication for continued ear pain.  Protocols used: INFORMATION ONLY CALL-A-AH

## 2017-03-19 NOTE — Progress Notes (Signed)
Chief Complaint  Patient presents with  . Ear Pain    right ear pain.  completed prescription on monday.    Pt is here for right ear pain. Duration: 9 days Progression: worse Associated symptoms: Internal ear pain Denies: fevers, bleeding, or discharge from ear Treatment to date: prednisone burst (40 mg/d for 5 days)- felt like it has opened up, but pain has worsened starting yesterday   ROS:  HEENT: +ear pain Costitutional: Denies fevers  Past Medical History:  Diagnosis Date  . 10 year risk of MI or stroke 7.5% or greater 08/08/2016  . Allergy   . Anxiety   . Coronary atherosclerosis 08/08/2016  . Depression   . Tobacco abuse 08/08/2016   BP 110/70 (BP Location: Left Arm, Patient Position: Sitting, Cuff Size: Normal)   Pulse 69   Temp 97.9 F (36.6 C) (Oral)   Ht 5\' 3"  (1.6 m)   Wt 135 lb (61.2 kg)   SpO2 96%   BMI 23.91 kg/m  General: Awake, alert, appearing stated age  HEENT:  L ear- Canal patent without drainage or erythema, TM is neg R ear- canal patent without drainage or erythema, TM is bulging without erythema or perforation Nose- nares patent and without discharge Mouth- Lips, gums and dentition unremarkable, pharynx is without erythema or exudate Neck: No adenopathy Lungs: Normal effort, no accessory muscle use Psych: Age appropriate judgment and insight, normal mood and affect  Right ear pain - Plan: Ambulatory referral to ENT  Will try to get pt in to ENT before the end of week. She has hx of tympanoplasty for similar issues in past. She has tramadol at home and has been using ibuprofen. Continue this.  F/u prn otherwise.  Pt voiced understanding and agreement to the plan.  Dewart, DO 03/19/17 4:43 PM

## 2017-03-19 NOTE — Telephone Encounter (Signed)
I'd like to take a look. Sry for the inconvenience, but it will affect our plan. TY.

## 2017-03-20 DIAGNOSIS — H9201 Otalgia, right ear: Secondary | ICD-10-CM | POA: Diagnosis not present

## 2017-03-20 DIAGNOSIS — M7918 Myalgia, other site: Secondary | ICD-10-CM | POA: Diagnosis not present

## 2017-03-20 DIAGNOSIS — H6991 Unspecified Eustachian tube disorder, right ear: Secondary | ICD-10-CM | POA: Diagnosis not present

## 2017-03-20 DIAGNOSIS — H6981 Other specified disorders of Eustachian tube, right ear: Secondary | ICD-10-CM | POA: Diagnosis not present

## 2017-03-25 DIAGNOSIS — H6693 Otitis media, unspecified, bilateral: Secondary | ICD-10-CM | POA: Diagnosis not present

## 2017-03-26 ENCOUNTER — Ambulatory Visit: Payer: Medicare HMO | Admitting: Family Medicine

## 2017-06-23 DIAGNOSIS — M2041 Other hammer toe(s) (acquired), right foot: Secondary | ICD-10-CM | POA: Diagnosis not present

## 2017-06-23 DIAGNOSIS — M7751 Other enthesopathy of right foot: Secondary | ICD-10-CM | POA: Diagnosis not present

## 2017-06-23 DIAGNOSIS — M7741 Metatarsalgia, right foot: Secondary | ICD-10-CM | POA: Diagnosis not present

## 2017-07-14 DIAGNOSIS — M7741 Metatarsalgia, right foot: Secondary | ICD-10-CM | POA: Diagnosis not present

## 2017-07-26 ENCOUNTER — Other Ambulatory Visit: Payer: Self-pay | Admitting: Family Medicine

## 2017-07-26 DIAGNOSIS — F419 Anxiety disorder, unspecified: Secondary | ICD-10-CM

## 2017-08-05 DIAGNOSIS — B353 Tinea pedis: Secondary | ICD-10-CM | POA: Diagnosis not present

## 2017-09-10 ENCOUNTER — Encounter: Payer: Self-pay | Admitting: Family Medicine

## 2017-09-10 ENCOUNTER — Ambulatory Visit (HOSPITAL_BASED_OUTPATIENT_CLINIC_OR_DEPARTMENT_OTHER)
Admission: RE | Admit: 2017-09-10 | Discharge: 2017-09-10 | Disposition: A | Discharge status: Home / Self Care | Payer: Medicare HMO | Source: Ambulatory Visit | Attending: Family Medicine | Admitting: Family Medicine

## 2017-09-10 ENCOUNTER — Ambulatory Visit (INDEPENDENT_AMBULATORY_CARE_PROVIDER_SITE_OTHER): Payer: Medicare HMO | Admitting: Family Medicine

## 2017-09-10 ENCOUNTER — Other Ambulatory Visit: Payer: Self-pay | Admitting: Family Medicine

## 2017-09-10 VITALS — BP 122/74 | HR 62 | Temp 98.1°F | Resp 16 | Ht 63.0 in | Wt 133.4 lb

## 2017-09-10 DIAGNOSIS — Z1231 Encounter for screening mammogram for malignant neoplasm of breast: Secondary | ICD-10-CM | POA: Insufficient documentation

## 2017-09-10 DIAGNOSIS — I2510 Atherosclerotic heart disease of native coronary artery without angina pectoris: Secondary | ICD-10-CM | POA: Diagnosis not present

## 2017-09-10 DIAGNOSIS — Z780 Asymptomatic menopausal state: Secondary | ICD-10-CM | POA: Diagnosis not present

## 2017-09-10 DIAGNOSIS — Z23 Encounter for immunization: Secondary | ICD-10-CM | POA: Diagnosis not present

## 2017-09-10 DIAGNOSIS — Z1211 Encounter for screening for malignant neoplasm of colon: Secondary | ICD-10-CM

## 2017-09-10 DIAGNOSIS — R69 Illness, unspecified: Secondary | ICD-10-CM | POA: Diagnosis not present

## 2017-09-10 DIAGNOSIS — I700 Atherosclerosis of aorta: Secondary | ICD-10-CM

## 2017-09-10 DIAGNOSIS — E2839 Other primary ovarian failure: Secondary | ICD-10-CM

## 2017-09-10 DIAGNOSIS — M8588 Other specified disorders of bone density and structure, other site: Secondary | ICD-10-CM | POA: Diagnosis not present

## 2017-09-10 DIAGNOSIS — Z0000 Encounter for general adult medical examination without abnormal findings: Secondary | ICD-10-CM | POA: Diagnosis not present

## 2017-09-10 DIAGNOSIS — F17210 Nicotine dependence, cigarettes, uncomplicated: Secondary | ICD-10-CM | POA: Diagnosis not present

## 2017-09-10 DIAGNOSIS — J432 Centrilobular emphysema: Secondary | ICD-10-CM | POA: Diagnosis not present

## 2017-09-10 DIAGNOSIS — Z122 Encounter for screening for malignant neoplasm of respiratory organs: Secondary | ICD-10-CM | POA: Insufficient documentation

## 2017-09-10 DIAGNOSIS — Z13820 Encounter for screening for osteoporosis: Secondary | ICD-10-CM | POA: Diagnosis not present

## 2017-09-10 DIAGNOSIS — M810 Age-related osteoporosis without current pathological fracture: Secondary | ICD-10-CM | POA: Insufficient documentation

## 2017-09-10 MED ORDER — TRAZODONE HCL 50 MG PO TABS: 25.0000 mg | ORAL_TABLET | Freq: Every evening | ORAL | 3 refills | Status: DC | PRN

## 2017-09-10 MED ORDER — ATORVASTATIN CALCIUM 10 MG PO TABS: 10.0000 mg | ORAL_TABLET | Freq: Every day | ORAL | 3 refills | Status: DC

## 2017-09-10 NOTE — Addendum Note (Signed)
Addended by: Bartholome Bill on: 09/10/2017 10:20 AM   Modules accepted: Orders

## 2017-09-10 NOTE — Progress Notes (Signed)
Chief Complaint  Patient presents with  . Annual Exam     Well Woman Sharon Schmidt is here for a complete physical.   Her last physical was >1 year ago.  Current diet: in general, a "healthy" diet. Current exercise: walking, swimming, yard work Weight is stable and shedenies daytime fatigue. No LMP recorded. Patient is postmenopausal.. Seatbelt? Yes  Health Maintenance Colonoscopy- Yes Shingrix- Yes - needs 2nd shot today Lung cancer screening- No DEXA- No Mammogram- Getting scheduled Tetanus- No Pneumonia- Yes Hep C screen- Yes  Past Medical History:  Diagnosis Date  . 10 year risk of MI or stroke 7.5% or greater 08/08/2016  . Allergy   . Anxiety   . Coronary atherosclerosis 08/08/2016  . Depression   . Tobacco abuse 08/08/2016     Past Surgical History:  Procedure Laterality Date  . CESAREAN SECTION    . KNEE SURGERY     x2  . OVARIAN CYST REMOVAL    . TONSILLECTOMY      Medications  Current Outpatient Medications on File Prior to Visit  Medication Sig Dispense Refill  . Cholecalciferol (VITAMIN D PO) Take by mouth. Take 1 gel cap by mouth daily.    Marland Kitchen ibuprofen (ADVIL,MOTRIN) 200 MG tablet Take 200 mg by mouth at bedtime as needed.    Marland Kitchen LORazepam (ATIVAN) 1 MG tablet TAKE 1 TABLET BY MOUTH TWICE DAILY 60 tablet 5  . OVER THE COUNTER MEDICATION Multivitamin Chewable-Take 1 chewable daily.    Marland Kitchen OVER THE COUNTER MEDICATION Antiacid-Take 1 tablet by mouth as needed.    . vitamin E 400 UNIT capsule Take 800 Units by mouth daily.     Allergies No Known Allergies  Review of Systems: Constitutional:  no sweats Eye:  no recent significant change in vision Ear/Nose/Mouth/Throat:  Ears:  No changes in hearing Nose/Mouth/Throat:  no complaints of nasal congestion, no sore throat Cardiovascular: no chest pain Respiratory:  No cough and no shortness of breath Gastrointestinal:  no abdominal pain, no change in bowel habits GU:  Female: negative for dysuria or pelvic  pain Musculoskeletal/Extremities: +low back pain; otherwise no pain of the joints Integumentary (Skin/Breast): resolving no abnormal skin lesions reported Neurologic:  no headaches Psychiatric:  no anxiety, no depression Endocrine:  denies unexplained weight changes Hematologic/Lymphatic:  no abnormal bleeding  Exam BP 122/74   Pulse 62   Temp 98.1 F (36.7 C) (Oral)   Resp 16   Ht 5\' 3"  (1.6 m)   Wt 133 lb 6.4 oz (60.5 kg)   SpO2 100%   BMI 23.63 kg/m  General:  well developed, well nourished, in no apparent distress Skin:  no significant moles, warts, or growths Head:  no masses, lesions, or tenderness Eyes:  pupils equal and round, sclera anicteric without injection Ears:  canals without lesions, TMs shiny without retraction, no obvious effusion, no erythema Nose:  nares patent, septum midline, mucosa normal, and no drainage or sinus tenderness Throat/Pharynx:  lips and gingiva without lesion; tongue and uvula midline; non-inflamed pharynx; no exudates or postnasal drainage Neck: neck supple without adenopathy, thyromegaly, or masses Lungs:  clear to auscultation, breath sounds equal bilaterally, no respiratory distress Cardio:  regular rate and rhythm, no bruits or LE edema Abdomen:  abdomen soft, nontender; bowel sounds normal; no masses or organomegaly Genital: Deferred Musculoskeletal:  symmetrical muscle groups noted without atrophy or deformity Extremities:  no clubbing, cyanosis, or edema, no deformities, no skin discoloration Neuro:  gait normal; deep tendon reflexes normal and  symmetric Psych: well oriented with normal range of affect and appropriate judgment/insight  Assessment and Plan  Well adult exam - Plan: Comprehensive metabolic panel, Lipid panel  Screen for colon cancer - Plan: Ambulatory referral to Gastroenterology  Estrogen deficiency - Plan: DG Bone Density  Smokes with greater than 30 pack year history - Plan: CT CHEST LUNG CANCER SCREENING LOW  DOSE WO CONTRAST  Aortic atherosclerosis ([place]) - Plan: atorvastatin (LIPITOR) 10 MG tablet   Well 67 y.o. female. Counseled on diet and exercise. Try Lipitor for AA. Try trazodone as alt to Ativan. CBT info given. Update tetanus and get second dose of Shingrix.  Other orders as above. Follow up in 1 year pending the above workup. The patient voiced understanding and agreement to the plan.  [place], DO 09/10/17 9:56 AM

## 2017-09-10 NOTE — Patient Instructions (Addendum)
Keep up the good work.   Stop smoking.   Keep the diet clean and stay active.  Wear sunscreen.  Please consider cognitive behavioral therapy. Contact [phone] to schedule an appointment or inquire about cost/insurance coverage.  Let us know if you need anything.

## 2017-09-11 ENCOUNTER — Other Ambulatory Visit: Payer: Self-pay | Admitting: Family Medicine

## 2017-09-11 DIAGNOSIS — R928 Other abnormal and inconclusive findings on diagnostic imaging of breast: Secondary | ICD-10-CM

## 2017-09-11 LAB — COMPREHENSIVE METABOLIC PANEL
ALK PHOS: 43 U/L (ref 39–117)
ALT: 11 U/L (ref 0–35)
AST: 19 U/L (ref 0–37)
Albumin: 4.1 g/dL (ref 3.5–5.2)
BUN: 9 mg/dL (ref 6–23)
CO2: 27 mEq/L (ref 19–32)
Calcium: 9 mg/dL (ref 8.4–10.5)
Chloride: 101 mEq/L (ref 96–112)
Creatinine, Ser: 0.79 mg/dL (ref 0.40–1.20)
GFR: 77.14 mL/min (ref >60.00)
GLUCOSE: 97 mg/dL (ref 70–99)
POTASSIUM: 4.1 meq/L (ref 3.5–5.1)
Sodium: 136 mEq/L (ref 135–145)
TOTAL PROTEIN: 6.5 g/dL (ref 6.0–8.3)
Total Bilirubin: 0.6 mg/dL (ref 0.2–1.2)

## 2017-09-11 LAB — LIPID PANEL
CHOLESTEROL: 190 mg/dL (ref 0–200)
HDL: 80.2 mg/dL (ref >39.00)
LDL CALC: 93 mg/dL (ref 0–99)
NONHDL: 109.9
Total CHOL/HDL Ratio: 2
Triglycerides: 84 mg/dL (ref 0.0–149.0)
VLDL: 16.8 mg/dL (ref 0.0–40.0)

## 2017-09-12 ENCOUNTER — Other Ambulatory Visit: Payer: Self-pay

## 2017-09-12 DIAGNOSIS — M810 Age-related osteoporosis without current pathological fracture: Secondary | ICD-10-CM

## 2017-09-15 ENCOUNTER — Telehealth: Payer: Self-pay | Admitting: *Deleted

## 2017-09-15 NOTE — Telephone Encounter (Signed)
Received Physician Orders from [place]; forwarded to provider/SLS 07/15

## 2017-09-16 ENCOUNTER — Telehealth: Payer: Self-pay | Admitting: Gastroenterology

## 2017-09-16 DIAGNOSIS — M1990 Unspecified osteoarthritis, unspecified site: Secondary | ICD-10-CM | POA: Diagnosis not present

## 2017-09-16 DIAGNOSIS — R69 Illness, unspecified: Secondary | ICD-10-CM | POA: Diagnosis not present

## 2017-09-16 DIAGNOSIS — G8929 Other chronic pain: Secondary | ICD-10-CM | POA: Diagnosis not present

## 2017-09-16 DIAGNOSIS — J309 Allergic rhinitis, unspecified: Secondary | ICD-10-CM | POA: Diagnosis not present

## 2017-09-16 DIAGNOSIS — M792 Neuralgia and neuritis, unspecified: Secondary | ICD-10-CM | POA: Diagnosis not present

## 2017-09-16 DIAGNOSIS — H269 Unspecified cataract: Secondary | ICD-10-CM | POA: Diagnosis not present

## 2017-09-16 DIAGNOSIS — K219 Gastro-esophageal reflux disease without esophagitis: Secondary | ICD-10-CM | POA: Diagnosis not present

## 2017-09-16 DIAGNOSIS — E785 Hyperlipidemia, unspecified: Secondary | ICD-10-CM | POA: Diagnosis not present

## 2017-09-16 DIAGNOSIS — H547 Unspecified visual loss: Secondary | ICD-10-CM | POA: Diagnosis not present

## 2017-09-16 DIAGNOSIS — G4700 Insomnia, unspecified: Secondary | ICD-10-CM | POA: Diagnosis not present

## 2017-09-16 NOTE — Telephone Encounter (Signed)
Received colon/path report. Patient is requesting to see Dr. Lyndel Safe. Records placed on his desk for review.

## 2017-09-17 ENCOUNTER — Ambulatory Visit
Admission: RE | Admit: 2017-09-17 | Discharge: 2017-09-17 | Disposition: A | Discharge status: Home / Self Care | Payer: Medicare HMO | Source: Ambulatory Visit | Attending: Family Medicine | Admitting: Family Medicine

## 2017-09-17 ENCOUNTER — Other Ambulatory Visit: Payer: Self-pay | Admitting: Family Medicine

## 2017-09-17 DIAGNOSIS — N6489 Other specified disorders of breast: Secondary | ICD-10-CM

## 2017-09-17 DIAGNOSIS — R928 Other abnormal and inconclusive findings on diagnostic imaging of breast: Secondary | ICD-10-CM

## 2017-09-19 ENCOUNTER — Other Ambulatory Visit: Payer: Self-pay | Admitting: Family Medicine

## 2017-09-19 ENCOUNTER — Other Ambulatory Visit (INDEPENDENT_AMBULATORY_CARE_PROVIDER_SITE_OTHER): Payer: Medicare HMO

## 2017-09-19 DIAGNOSIS — M810 Age-related osteoporosis without current pathological fracture: Secondary | ICD-10-CM | POA: Diagnosis not present

## 2017-09-19 LAB — VITAMIN D 25 HYDROXY (VIT D DEFICIENCY, FRACTURES): VITD: 31.31 ng/mL (ref 30.00–100.00)

## 2017-09-19 MED ORDER — ALENDRONATE SODIUM 70 MG PO TABS: 70.0000 mg | ORAL_TABLET | ORAL | 3 refills | Status: DC

## 2017-09-24 ENCOUNTER — Other Ambulatory Visit: Payer: Self-pay | Admitting: Family Medicine

## 2017-09-24 ENCOUNTER — Ambulatory Visit
Admission: RE | Admit: 2017-09-24 | Discharge: 2017-09-24 | Disposition: A | Discharge status: Home / Self Care | Payer: Medicare HMO | Source: Ambulatory Visit | Attending: Family Medicine | Admitting: Family Medicine

## 2017-09-24 DIAGNOSIS — R928 Other abnormal and inconclusive findings on diagnostic imaging of breast: Secondary | ICD-10-CM | POA: Diagnosis not present

## 2017-09-24 DIAGNOSIS — N6012 Diffuse cystic mastopathy of left breast: Secondary | ICD-10-CM | POA: Diagnosis not present

## 2017-09-24 DIAGNOSIS — N6489 Other specified disorders of breast: Secondary | ICD-10-CM

## 2017-09-24 HISTORY — PX: BREAST BIOPSY: SHX20

## 2017-09-24 NOTE — Telephone Encounter (Signed)
LM on Vmail to call back to schedule.  Records in records reviewed folder

## 2017-09-25 ENCOUNTER — Encounter: Payer: Self-pay | Admitting: Gastroenterology

## 2017-11-06 ENCOUNTER — Ambulatory Visit (AMBULATORY_SURGERY_CENTER): Payer: Self-pay | Admitting: *Deleted

## 2017-11-06 ENCOUNTER — Encounter: Payer: Self-pay | Admitting: Gastroenterology

## 2017-11-06 VITALS — Ht 63.0 in | Wt 133.0 lb

## 2017-11-06 DIAGNOSIS — Z86010 Personal history of colonic polyps: Secondary | ICD-10-CM

## 2017-11-06 MED ORDER — NA SULFATE-K SULFATE-MG SULF 17.5-3.13-1.6 GM/177ML PO SOLN
1.0000 | Freq: Once | ORAL | 0 refills | Status: AC
Start: 2017-11-06 — End: 2017-11-06

## 2017-11-06 NOTE — Progress Notes (Signed)
Denies allergies to eggs or soy products. Denies complications with sedation or anesthesia. Denies O2 use. Denies use of diet or weight loss medications.  Emmi instructions given for colonoscopy.  

## 2017-11-20 ENCOUNTER — Encounter: Payer: Self-pay | Admitting: Gastroenterology

## 2017-11-20 ENCOUNTER — Ambulatory Visit (AMBULATORY_SURGERY_CENTER): Payer: Medicare HMO | Admitting: Gastroenterology

## 2017-11-20 VITALS — BP 113/59 | HR 58 | Temp 98.4°F | Resp 12 | Ht 63.0 in | Wt 133.0 lb

## 2017-11-20 DIAGNOSIS — R69 Illness, unspecified: Secondary | ICD-10-CM | POA: Diagnosis not present

## 2017-11-20 DIAGNOSIS — Z1211 Encounter for screening for malignant neoplasm of colon: Secondary | ICD-10-CM | POA: Diagnosis not present

## 2017-11-20 DIAGNOSIS — Z86010 Personal history of colonic polyps: Secondary | ICD-10-CM | POA: Diagnosis not present

## 2017-11-20 DIAGNOSIS — K621 Rectal polyp: Secondary | ICD-10-CM

## 2017-11-20 DIAGNOSIS — D128 Benign neoplasm of rectum: Secondary | ICD-10-CM

## 2017-11-20 DIAGNOSIS — D129 Benign neoplasm of anus and anal canal: Secondary | ICD-10-CM

## 2017-11-20 MED ORDER — SODIUM CHLORIDE 0.9 % IV SOLN: 500.0000 mL | Freq: Once | INTRAVENOUS | Status: DC

## 2017-11-20 NOTE — Progress Notes (Signed)
Called to room to assist during endoscopic procedure.  Patient ID and intended procedure confirmed with present staff. Received instructions for my participation in the procedure from the performing physician.  

## 2017-11-20 NOTE — Progress Notes (Signed)
Pt's states no medical or surgical changes since previsit or office visit. 

## 2017-11-20 NOTE — Patient Instructions (Signed)
Handouts given on polyps and hemorrhoids   YOU HAD AN ENDOSCOPIC PROCEDURE TODAY AT THE [facility]:   Refer to the procedure report that was given to you for any specific questions about what was found during the examination.  If the procedure report does not answer your questions, please call your gastroenterologist to clarify.  If you requested that your care partner not be given the details of your procedure findings, then the procedure report has been included in a sealed envelope for you to review at your convenience later.  YOU SHOULD EXPECT: Some feelings of bloating in the abdomen. Passage of more gas than usual.  Walking can help get rid of the air that was put into your GI tract during the procedure and reduce the bloating. If you had a lower endoscopy (such as a colonoscopy or flexible sigmoidoscopy) you may notice spotting of blood in your stool or on the toilet paper. If you underwent a bowel prep for your procedure, you may not have a normal bowel movement for a few days.  Please Note:  You might notice some irritation and congestion in your nose or some drainage.  This is from the oxygen used during your procedure.  There is no need for concern and it should clear up in a day or so.  SYMPTOMS TO REPORT IMMEDIATELY:   Following lower endoscopy (colonoscopy or flexible sigmoidoscopy):  Excessive amounts of blood in the stool  Significant tenderness or worsening of abdominal pains  Swelling of the abdomen that is new, acute  Fever of 100F or higher    For urgent or emergent issues, a gastroenterologist can be reached at any hour by calling [phone].   DIET:  We do recommend a small meal at first, but then you may proceed to your regular diet.  Drink plenty of fluids but you should avoid alcoholic beverages for 24 hours.  ACTIVITY:  You should plan to take it easy for the rest of today and you should NOT DRIVE or use heavy machinery until tomorrow (because of  the sedation medicines used during the test).    FOLLOW UP: Our staff will call the number listed on your records the next business day following your procedure to check on you and address any questions or concerns that you may have regarding the information given to you following your procedure. If we do not reach you, we will leave a message.  However, if you are feeling well and you are not experiencing any problems, there is no need to return our call.  We will assume that you have returned to your regular daily activities without incident.  If any biopsies were taken you will be contacted by phone or by letter within the next 1-3 weeks.  Please call us at [phone] if you have not heard about the biopsies in 3 weeks.    SIGNATURES/CONFIDENTIALITY: You and/or your care partner have signed paperwork which will be entered into your electronic medical record.  These signatures attest to the fact that that the information above on your After Visit Summary has been reviewed and is understood.  Full responsibility of the confidentiality of this discharge information lies with you and/or your care-partner. 

## 2017-11-20 NOTE — Op Note (Signed)
[facility] Patient Name: Jaleiah Asay Procedure Date: 11/20/2017 1:36 PM MRN: [id number] Endoscopist: Jackquline Denmark , MD Age: 67 Referring MD:  Date of Birth: October 14, 1950 Gender: Female Account #: [id number] Procedure:                Colonoscopy Indications:              High risk colon cancer surveillance: Personal                            history of colonic polyps Medicines:                Monitored Anesthesia Care Procedure:                Pre-Anesthesia Assessment:                           - Prior to the procedure, a History and Physical                            was performed, and patient medications and                            allergies were reviewed. The patient's tolerance of                            previous anesthesia was also reviewed. The risks                            and benefits of the procedure and the sedation                            options and risks were discussed with the patient.                            All questions were answered, and informed consent                            was obtained. Prior Anticoagulants: The patient has                            taken no previous anticoagulant or antiplatelet                            agents. ASA Grade Assessment: II - A patient with                            mild systemic disease. After reviewing the risks                            and benefits, the patient was deemed in                            satisfactory condition to undergo the procedure.  After obtaining informed consent, the colonoscope                            was passed under direct vision. Throughout the                            procedure, the patient's blood pressure, pulse, and                            oxygen saturations were monitored continuously. The                            Colonoscope was introduced through the anus and                            advanced to the 2 cm into the ileum. The                             colonoscopy was performed without difficulty. The                            patient tolerated the procedure well. The quality                            of the bowel preparation was excellent. The colon                            was highly redundant. Scope In: 1:39:50 PM Scope Out: 1:54:45 PM Scope Withdrawal Time: 0 hours 9 minutes 24 seconds  Total Procedure Duration: 0 hours 14 minutes 55 seconds  Findings:                 A 4 mm polyp was found in the rectum. The polyp was                            sessile. The polyp was removed with a cold biopsy                            forceps. Resection and retrieval were complete.                            Estimated blood loss: none.                           Incidental note was made of a small 6 mm                            nonbleeding AVM in the cecum.                           Non-bleeding internal hemorrhoids were found during                            retroflexion. The hemorrhoids were small. Rectal  scar was noted.                           The exam was otherwise without abnormality. Complications:            No immediate complications. Estimated Blood Loss:     Estimated blood loss: none. Impression:               - Diminutive rectal polyp status post polypectomy.                           - Non-bleeding small cecal AVM.                           - The examination was otherwise normal to TI. Recommendation:           - Patient has a contact number available for                            emergencies. The signs and symptoms of potential                            delayed complications were discussed with the                            patient. Return to normal activities tomorrow.                            Written discharge instructions were provided to the                            patient.                           - Resume previous diet.                           -  Continue present medications.                           - Await pathology results.                           - Repeat colonoscopy for surveillance based on                            pathology results.                           - Return to GI clinic PRN. Jackquline Denmark, MD 11/20/2017 1:59:52 PM This report has been signed electronically.

## 2017-11-20 NOTE — Progress Notes (Signed)
Report to PACU, RN, vss, BBS= Clear.  

## 2017-11-21 ENCOUNTER — Telehealth: Payer: Self-pay

## 2017-11-21 NOTE — Telephone Encounter (Signed)
  Follow up Call-  Call back number 11/20/2017  Post procedure Call Back phone  # [phone]  Permission to leave phone message Yes  Some recent data might be hidden     Patient questions:  Do you have a fever, pain , or abdominal swelling? No. Pain Score  0 *  Have you tolerated food without any problems? Yes.    Have you been able to return to your normal activities? Yes.    Do you have any questions about your discharge instructions: Diet   No. Medications  No. Follow up visit  No.  Do you have questions or concerns about your Care? No.  Actions: * If pain score is 4 or above: No action needed, pain <4.

## 2017-11-25 ENCOUNTER — Encounter: Payer: Self-pay | Admitting: Gastroenterology

## 2017-12-02 DIAGNOSIS — H5203 Hypermetropia, bilateral: Secondary | ICD-10-CM | POA: Diagnosis not present

## 2017-12-02 DIAGNOSIS — H52223 Regular astigmatism, bilateral: Secondary | ICD-10-CM | POA: Diagnosis not present

## 2017-12-02 DIAGNOSIS — H524 Presbyopia: Secondary | ICD-10-CM | POA: Diagnosis not present

## 2017-12-25 ENCOUNTER — Encounter: Payer: Self-pay | Admitting: Family Medicine

## 2017-12-25 ENCOUNTER — Ambulatory Visit (INDEPENDENT_AMBULATORY_CARE_PROVIDER_SITE_OTHER): Payer: Medicare HMO | Admitting: Family Medicine

## 2017-12-25 VITALS — BP 112/70 | HR 68 | Temp 98.0°F | Ht 63.0 in | Wt 133.5 lb

## 2017-12-25 DIAGNOSIS — Z23 Encounter for immunization: Secondary | ICD-10-CM | POA: Diagnosis not present

## 2017-12-25 DIAGNOSIS — L821 Other seborrheic keratosis: Secondary | ICD-10-CM

## 2017-12-25 DIAGNOSIS — D489 Neoplasm of uncertain behavior, unspecified: Secondary | ICD-10-CM

## 2017-12-25 DIAGNOSIS — M810 Age-related osteoporosis without current pathological fracture: Secondary | ICD-10-CM | POA: Diagnosis not present

## 2017-12-25 DIAGNOSIS — L820 Inflamed seborrheic keratosis: Secondary | ICD-10-CM | POA: Diagnosis not present

## 2017-12-25 NOTE — Patient Instructions (Addendum)
Do not shower for the rest of the day. When you do wash it, use only soap and water. Do not vigorously scrub. Apply triple antibiotic ointment (like Neosporin) twice daily. Keep the area clean and dry.   Things to look out for: increasing pain not relieved by ibuprofen/acetaminophen, fevers, spreading redness, drainage of pus, or foul odor.  Give [place] 1 business week to get the results of your biopsies back.  Wear sunscreen and use sun protection.  We will be in touch regarding getting the Prolia (injections for osteoporosis) set up. If you don't hear anything in the next 2-3 weeks, reach out and ask for an update.  Let us know if you need anything.

## 2017-12-25 NOTE — Progress Notes (Signed)
Chief Complaint  Patient presents with  . spot on right side of her forehead    Sharon Schmidt is a 67 y.o. female here for a skin complaint.  Duration: 2 months Location: R side of forehead and left neck Pruritic? Yes Painful? No Drainage? No New soaps/lotions/topicals/detergents? No Other associated symptoms: area on forehead will bleed; arm on neck is getting larger Therapies tried thus far: TAO, peroxide  Hx of osteoporosis, tried taking Fosamax. Started having nausea/vomiting, pain with ingestion of anything solid. Has not tried anything else. Continues to smoke daily. Does take Vit D/Ca supp.  ROS:  Const: No fevers Skin: As noted in HPI  Past Medical History:  Diagnosis Date  . 10 year risk of MI or stroke 7.5% or greater 08/08/2016  . Allergy   . Anxiety   . Cataracts, bilateral   . Coronary atherosclerosis 08/08/2016  . Depression   . Osteoporosis   . Tobacco abuse 08/08/2016    BP 112/70 (BP Location: Left Arm, Patient Position: Sitting, Cuff Size: Normal)   Pulse 68   Temp 98 F (36.7 C) (Oral)   Ht 5\' 3"  (1.6 m)   Wt 133 lb 8 oz (60.6 kg)   SpO2 96%   BMI 23.65 kg/m  Gen: awake, alert, appearing stated age Lungs: No accessory muscle use Skin: See below. Forehead there is no drainage, erythema, fluctuance. On neck, no drainage, erythema, TTP, fluctuance, excoriation Psych: Age appropriate judgment and insight        Procedure note; shave biopsy Informed consent was obtained. The area on neck was cleaned with alcohol and injected with 0.5 mL of 1% lidocaine with epinephrine. A Dermablade was slightly bent and used to cut under the area of interest. The specimen was placed in a sterile specimen cup and sent to the lab. The area was then cauterized ensuring adequate hemostasis. The area was dressed with triple antibiotic ointment and a bandage. This process was repeated on the R side of forehead with 0.3 cc's 1% lido w/ epi used for local. There were no  complications noted. The patient tolerated the procedure well.   Neoplasm of uncertain behavior - Plan: Dermatology pathology([place]), Dermatology pathology([place]), PR SHAV SKIN LES < 0.5 CM TRUNK,ARM,LEG, PR SHAV SKIN LES <0.5 CM FACE,FACIAL  Osteoporosis without current pathological fracture, unspecified osteoporosis type  Flu vaccine need - Plan: Flu vaccine HIGH DOSE PF (Fluzone High dose)  Orders as above. Worry about SCC on forehead. Will try to get pt set up with Prolia as she does not tolerate bisphosphonate.  Aftercare/warning s/s's discussed and written down in AVS. F/u prn. The patient voiced understanding and agreement to the plan.  [place], DO 12/25/17 12:02 PM

## 2017-12-25 NOTE — Progress Notes (Signed)
Pre visit review using our clinic review tool, if applicable. No additional management support is needed unless otherwise documented below in the visit note. 

## 2017-12-31 ENCOUNTER — Telehealth: Payer: Self-pay | Admitting: *Deleted

## 2017-12-31 NOTE — Telephone Encounter (Signed)
Received Dermatopathology Report results from [facility]; forwarded to provider/SLS 10/30

## 2018-03-13 ENCOUNTER — Ambulatory Visit: Payer: Medicare HMO | Admitting: Family Medicine

## 2018-04-27 ENCOUNTER — Other Ambulatory Visit: Payer: Self-pay | Admitting: Family Medicine

## 2018-04-27 DIAGNOSIS — N6012 Diffuse cystic mastopathy of left breast: Secondary | ICD-10-CM

## 2018-04-29 ENCOUNTER — Telehealth: Payer: Self-pay | Admitting: *Deleted

## 2018-04-29 NOTE — Telephone Encounter (Signed)
Received Physician Orders from [place]; forwarded to provider/SLS 02/26

## 2018-05-04 ENCOUNTER — Ambulatory Visit
Admission: RE | Admit: 2018-05-04 | Discharge: 2018-05-04 | Disposition: A | Discharge status: Home / Self Care | Payer: Medicare HMO | Source: Ambulatory Visit | Attending: Family Medicine | Admitting: Family Medicine

## 2018-05-04 ENCOUNTER — Ambulatory Visit: Admission: RE | Admit: 2018-05-04 | Payer: Medicare HMO | Source: Ambulatory Visit

## 2018-05-04 DIAGNOSIS — R928 Other abnormal and inconclusive findings on diagnostic imaging of breast: Secondary | ICD-10-CM | POA: Diagnosis not present

## 2018-05-04 DIAGNOSIS — N6012 Diffuse cystic mastopathy of left breast: Secondary | ICD-10-CM

## 2018-06-26 ENCOUNTER — Telehealth: Payer: Self-pay | Admitting: Family Medicine

## 2018-06-26 NOTE — Telephone Encounter (Signed)
Called the patient informed her that we are offering Virtual Visits at this time. Past was last seen in July 2019//12-2017---was to return in 6 months (after July appt) but she is currently doing well and has no medical concerns at this time She did appreciate the phone call/concern and will certainly call if need virtual visit.Sharon Schmidt

## 2018-08-26 DIAGNOSIS — H02834 Dermatochalasis of left upper eyelid: Secondary | ICD-10-CM | POA: Diagnosis not present

## 2018-08-26 DIAGNOSIS — H2512 Age-related nuclear cataract, left eye: Secondary | ICD-10-CM | POA: Diagnosis not present

## 2018-08-26 DIAGNOSIS — H02831 Dermatochalasis of right upper eyelid: Secondary | ICD-10-CM | POA: Diagnosis not present

## 2018-08-26 DIAGNOSIS — H25012 Cortical age-related cataract, left eye: Secondary | ICD-10-CM | POA: Diagnosis not present

## 2018-09-09 DIAGNOSIS — R69 Illness, unspecified: Secondary | ICD-10-CM | POA: Diagnosis not present

## 2018-09-10 DIAGNOSIS — R69 Illness, unspecified: Secondary | ICD-10-CM | POA: Diagnosis not present

## 2018-09-15 DIAGNOSIS — H25013 Cortical age-related cataract, bilateral: Secondary | ICD-10-CM | POA: Diagnosis not present

## 2018-09-15 DIAGNOSIS — H2513 Age-related nuclear cataract, bilateral: Secondary | ICD-10-CM | POA: Diagnosis not present

## 2018-09-15 DIAGNOSIS — H02834 Dermatochalasis of left upper eyelid: Secondary | ICD-10-CM | POA: Diagnosis not present

## 2018-09-15 DIAGNOSIS — H02831 Dermatochalasis of right upper eyelid: Secondary | ICD-10-CM | POA: Diagnosis not present

## 2018-09-30 DIAGNOSIS — H02831 Dermatochalasis of right upper eyelid: Secondary | ICD-10-CM | POA: Diagnosis not present

## 2018-09-30 DIAGNOSIS — H02834 Dermatochalasis of left upper eyelid: Secondary | ICD-10-CM | POA: Diagnosis not present

## 2018-10-06 DIAGNOSIS — R69 Illness, unspecified: Secondary | ICD-10-CM | POA: Diagnosis not present

## 2018-11-04 DIAGNOSIS — H02834 Dermatochalasis of left upper eyelid: Secondary | ICD-10-CM | POA: Diagnosis not present

## 2018-11-04 DIAGNOSIS — H02831 Dermatochalasis of right upper eyelid: Secondary | ICD-10-CM | POA: Diagnosis not present

## 2018-12-02 ENCOUNTER — Other Ambulatory Visit: Payer: Self-pay | Admitting: Family Medicine

## 2018-12-02 DIAGNOSIS — F419 Anxiety disorder, unspecified: Secondary | ICD-10-CM

## 2018-12-16 ENCOUNTER — Other Ambulatory Visit: Payer: Self-pay | Admitting: Family Medicine

## 2018-12-16 DIAGNOSIS — F419 Anxiety disorder, unspecified: Secondary | ICD-10-CM

## 2018-12-17 ENCOUNTER — Telehealth: Payer: Self-pay | Admitting: Family Medicine

## 2018-12-17 DIAGNOSIS — F419 Anxiety disorder, unspecified: Secondary | ICD-10-CM

## 2018-12-17 MED ORDER — LORAZEPAM 1 MG PO TABS: 0.5000 mg | ORAL_TABLET | Freq: Every day | ORAL | 0 refills | Status: DC | PRN

## 2018-12-17 NOTE — Telephone Encounter (Signed)
°  Relation to [id number]  Call back number: [phone] Pharmacy: [facility], [place] [phone] (Phone) [phone] (Fax)     Reason for call:  Patient requesting LORazepam (ATIVAN) 1 MG tablet, informed patient please allow 48 to 72 hour turn around time. Patient states she doesn't fill comfortable coming into the office due to pandemic

## 2018-12-17 NOTE — Telephone Encounter (Signed)
Please advise 

## 2018-12-17 NOTE — Telephone Encounter (Signed)
1 mo given. Does [employer] cover CPE's virtually? Could do that or an OV via telephone/E visit. OK to schedule next week. Ty.

## 2018-12-26 DIAGNOSIS — R69 Illness, unspecified: Secondary | ICD-10-CM | POA: Diagnosis not present

## 2019-02-04 ENCOUNTER — Other Ambulatory Visit: Payer: Self-pay | Admitting: Family Medicine

## 2019-02-04 DIAGNOSIS — F419 Anxiety disorder, unspecified: Secondary | ICD-10-CM

## 2019-02-04 NOTE — Telephone Encounter (Signed)
Requesting: Ativan Contract: N/A UDS: N/A Last OV: 12/25/2017 Next OV: 02/15/2019 Last Refill: 12/17/2018, #30--0 RF Database:   Please advise

## 2019-02-04 NOTE — Telephone Encounter (Signed)
rx refill  LORazepam (ATIVAN) 1 MG tablet PHARMACY [facility] [address] [place], [place] - [place] [phone] (Phone) [phone] (Fax)

## 2019-02-05 ENCOUNTER — Telehealth: Payer: Self-pay | Admitting: Family Medicine

## 2019-02-05 MED ORDER — LORAZEPAM 1 MG PO TABS: 0.5000 mg | ORAL_TABLET | Freq: Every day | ORAL | 0 refills | Status: DC | PRN

## 2019-02-05 NOTE — Telephone Encounter (Signed)
Please disregard message about appt, I see she is coming in. Ty.

## 2019-02-05 NOTE — Telephone Encounter (Signed)
Will refill 15. If she doesn't schedule a visit, I will not refill anymore. Again, this can be virtual if she is uncomfortable coming in to clinic. Ty.

## 2019-02-12 ENCOUNTER — Other Ambulatory Visit: Payer: Self-pay

## 2019-02-15 ENCOUNTER — Ambulatory Visit (INDEPENDENT_AMBULATORY_CARE_PROVIDER_SITE_OTHER): Payer: Medicare HMO | Admitting: Family Medicine

## 2019-02-15 ENCOUNTER — Encounter: Payer: Self-pay | Admitting: Family Medicine

## 2019-02-15 ENCOUNTER — Other Ambulatory Visit: Payer: Self-pay

## 2019-02-15 VITALS — BP 140/86 | HR 71 | Temp 95.4°F | Ht 63.0 in | Wt 139.0 lb

## 2019-02-15 DIAGNOSIS — Z0000 Encounter for general adult medical examination without abnormal findings: Secondary | ICD-10-CM | POA: Diagnosis not present

## 2019-02-15 DIAGNOSIS — R69 Illness, unspecified: Secondary | ICD-10-CM | POA: Diagnosis not present

## 2019-02-15 DIAGNOSIS — Z23 Encounter for immunization: Secondary | ICD-10-CM

## 2019-02-15 DIAGNOSIS — F419 Anxiety disorder, unspecified: Secondary | ICD-10-CM

## 2019-02-15 LAB — LIPID PANEL
Cholesterol: 225 mg/dL — ABNORMAL HIGH (ref 0–200)
HDL: 71.5 mg/dL (ref >39.00)
LDL Cholesterol: 137 mg/dL — ABNORMAL HIGH (ref 0–99)
NonHDL: 153.23
Total CHOL/HDL Ratio: 3
Triglycerides: 83 mg/dL (ref 0.0–149.0)
VLDL: 16.6 mg/dL (ref 0.0–40.0)

## 2019-02-15 LAB — COMPREHENSIVE METABOLIC PANEL
ALT: 13 U/L (ref 0–35)
AST: 18 U/L (ref 0–37)
Albumin: 4.2 g/dL (ref 3.5–5.2)
Alkaline Phosphatase: 56 U/L (ref 39–117)
BUN: 11 mg/dL (ref 6–23)
CO2: 26 mEq/L (ref 19–32)
Calcium: 9.2 mg/dL (ref 8.4–10.5)
Chloride: 100 mEq/L (ref 96–112)
Creatinine, Ser: 0.78 mg/dL (ref 0.40–1.20)
GFR: 73.34 mL/min (ref >60.00)
Glucose, Bld: 94 mg/dL (ref 70–99)
Potassium: 4.1 mEq/L (ref 3.5–5.1)
Sodium: 134 mEq/L — ABNORMAL LOW (ref 135–145)
Total Bilirubin: 0.5 mg/dL (ref 0.2–1.2)
Total Protein: 6.4 g/dL (ref 6.0–8.3)

## 2019-02-15 LAB — CBC
HCT: 38.3 % (ref 36.0–46.0)
Hemoglobin: 13 g/dL (ref 12.0–15.0)
MCHC: 33.9 g/dL (ref 30.0–36.0)
MCV: 90.6 fl (ref 78.0–100.0)
Platelets: 304 10*3/uL (ref 150.0–400.0)
RBC: 4.23 Mil/uL (ref 3.87–5.11)
RDW: 13.5 % (ref 11.5–15.5)
WBC: 7.7 10*3/uL (ref 4.0–10.5)

## 2019-02-15 MED ORDER — NORTRIPTYLINE HCL 10 MG PO CAPS: 10.0000 mg | ORAL_CAPSULE | Freq: Every day | ORAL | 5 refills | Status: DC

## 2019-02-15 NOTE — Patient Instructions (Signed)
Give us 2-3 business days to get the results of your labs back.   Keep the diet clean and stay active.  Let us know if you need anything. 

## 2019-02-15 NOTE — Progress Notes (Signed)
Chief Complaint  Patient presents with  . Annual Exam     Well Woman Sharon Schmidt is here for a complete physical.   Her last physical was >1 year ago.  Current diet: in general, diet is OK. Current exercise: walking. Weight is stable and she denies daytime fatigue. Seatbelt? Yes  Health Maintenance Colonoscopy- Yes Shingrix- Yes Lung cancer screening- Yes DEXA- Yes Mammogram- Yes Tetanus- Yes Pneumonia- needs PCV23 Hep C screen- Yes  Past Medical History:  Diagnosis Date  . 10 year risk of MI or stroke 7.5% or greater 08/08/2016  . Allergy   . Anxiety   . Cataracts, bilateral   . Coronary atherosclerosis 08/08/2016  . Depression   . Osteoporosis   . Tobacco abuse 08/08/2016     Past Surgical History:  Procedure Laterality Date  . BREAST BIOPSY Left 09/24/2017   benign  . CESAREAN SECTION    . KNEE SURGERY     x2  . OVARIAN CYST REMOVAL    . TONSILLECTOMY      Medications  Current Outpatient Medications on File Prior to Visit  Medication Sig Dispense Refill  . Cholecalciferol (VITAMIN D PO) Take by mouth. Take 1 gel cap by mouth daily.    Marland Kitchen ibuprofen (ADVIL,MOTRIN) 200 MG tablet Take 200 mg by mouth at bedtime as needed.    Marland Kitchen LORazepam (ATIVAN) 1 MG tablet Take 0.5-1 tablets (0.5-1 mg total) by mouth daily as needed for anxiety. 15 tablet 0  . Magnesium 250 MG TABS Take by mouth.    . Menthol, Topical Analgesic, (ICY HOT EX) Apply topically.    . NON FORMULARY     . OVER THE COUNTER MEDICATION Multivitamin Chewable-Take 1 chewable daily.    Marland Kitchen OVER THE COUNTER MEDICATION Antiacid-Take 1 tablet by mouth as needed.    . vitamin E 400 UNIT capsule Take 800 Units by mouth daily.     Allergies Allergies  Allergen Reactions  . Augmentin [Amoxicillin-Pot Clavulanate] Nausea Only  . Fosamax [Alendronate]     N/V/stomach upset  . Statins     Myalgias    Review of Systems: Constitutional:  no sweats Eye:  no recent significant change in  vision Ear/Nose/Mouth/Throat:  Ears:  No changes in hearing Nose/Mouth/Throat:  no complaints of nasal congestion, no sore throat Cardiovascular: no chest pain Respiratory:  No cough and no shortness of breath Gastrointestinal:  no abdominal pain, no change in bowel habits GU:  Female: negative for dysuria or pelvic pain Musculoskeletal/Extremities:  no pain of the joints Integumentary (Skin/Breast):  no abnormal skin lesions reported Neurologic:  no headaches Psychiatric:  no anxiety, no depression Endocrine:  denies unexplained weight changes Hematologic/Lymphatic:  no abnormal bleeding  Exam BP 140/86 (BP Location: Left Arm, Patient Position: Sitting, Cuff Size: Normal)   Pulse 71   Temp (!) 95.4 F (35.2 C) (Temporal)   Ht '5\' 3"'  (1.6 m)   Wt 139 lb (63 kg)   SpO2 95%   BMI 24.62 kg/m  General:  well developed, well nourished, in no apparent distress Skin:  no significant moles, warts, or growths Head:  no masses, lesions, or tenderness Eyes:  pupils equal and round, sclera anicteric without injection Ears:  canals without lesions, TMs shiny without retraction, no obvious effusion, no erythema Nose:  nares patent, septum midline, mucosa normal, and no drainage or sinus tenderness Throat/Pharynx:  lips and gingiva without lesion; tongue and uvula midline; non-inflamed pharynx; no exudates or postnasal drainage Neck: neck supple without adenopathy,  thyromegaly, or masses Lungs:  clear to auscultation, breath sounds equal bilaterally, no respiratory distress Cardio:  regular rate and rhythm, no bruits or LE edema Abdomen:  abdomen soft, nontender; bowel sounds normal; no masses or organomegaly Genital: Deferred Musculoskeletal:  symmetrical muscle groups noted without atrophy or deformity Extremities:  no clubbing, cyanosis, or edema, no deformities, no skin discoloration Neuro:  gait normal; deep tendon reflexes normal and symmetric Psych: well oriented with normal range of  affect and appropriate judgment/insight  Assessment and Plan  Well adult exam - Plan: CBC, Comp Met (CMET), Lipid Profile  Need for vaccination against Streptococcus pneumoniae - Plan: Pneumococcal polysaccharide vaccine 23-valent greater than or equal to 2yo subcutaneous/IM  Anxiety - Plan: nortriptyline (PAMELOR) 10 MG capsule   Well 68 y.o. female. Counseled on diet and exercise. Other orders as above. Add Pamelor qhs.  Follow up in 1 mo to reck med changes.  The patient voiced understanding and agreement to the plan.  [place], DO 02/15/19 11:56 AM

## 2019-03-15 ENCOUNTER — Ambulatory Visit: Payer: Medicare HMO | Admitting: Family Medicine

## 2019-03-15 DIAGNOSIS — H524 Presbyopia: Secondary | ICD-10-CM | POA: Diagnosis not present

## 2019-03-15 DIAGNOSIS — H5203 Hypermetropia, bilateral: Secondary | ICD-10-CM | POA: Diagnosis not present

## 2019-03-15 DIAGNOSIS — H52223 Regular astigmatism, bilateral: Secondary | ICD-10-CM | POA: Diagnosis not present

## 2019-03-16 ENCOUNTER — Other Ambulatory Visit: Payer: Self-pay

## 2019-03-17 ENCOUNTER — Encounter: Payer: Self-pay | Admitting: Family Medicine

## 2019-03-17 ENCOUNTER — Other Ambulatory Visit: Payer: Self-pay

## 2019-03-17 ENCOUNTER — Ambulatory Visit (INDEPENDENT_AMBULATORY_CARE_PROVIDER_SITE_OTHER): Payer: Medicare HMO | Admitting: Family Medicine

## 2019-03-17 VITALS — BP 132/80 | HR 80 | Temp 96.1°F | Ht 63.0 in | Wt 140.0 lb

## 2019-03-17 DIAGNOSIS — F17210 Nicotine dependence, cigarettes, uncomplicated: Secondary | ICD-10-CM

## 2019-03-17 DIAGNOSIS — R69 Illness, unspecified: Secondary | ICD-10-CM | POA: Diagnosis not present

## 2019-03-17 DIAGNOSIS — F419 Anxiety disorder, unspecified: Secondary | ICD-10-CM

## 2019-03-17 MED ORDER — CLONAZEPAM 1 MG PO TABS: 0.5000 mg | ORAL_TABLET | Freq: Two times a day (BID) | ORAL | 3 refills | Status: DC

## 2019-03-17 MED ORDER — BELSOMRA 20 MG PO TABS: 20.0000 mg | ORAL_TABLET | Freq: Every evening | ORAL | 5 refills | Status: DC | PRN

## 2019-03-17 NOTE — Progress Notes (Signed)
Chief Complaint  Patient presents with  . Follow-up    Subjective: Patient is a 69 y.o. female here for f/u.   Pt started on nortriptyline to replace Ativan. She did not do well. Did have left over Klonopin that she did better with this time. Would like to try that. Is not following w counselor or psych. Frustrated by pandemic restrictions. Trying to walk.  She is still smoking. Needs to have f/u low dose CT scan. Precontemplative phase.    ROS: Heart: Denies chest pain  Lungs: Denies SOB   Past Medical History:  Diagnosis Date  . 10 year risk of MI or stroke 7.5% or greater 08/08/2016  . Allergy   . Anxiety   . Cataracts, bilateral   . Coronary atherosclerosis 08/08/2016  . Depression   . Osteoporosis   . Tobacco abuse 08/08/2016    Objective: BP 132/80 (BP Location: Left Arm, Patient Position: Sitting, Cuff Size: Normal)   Pulse 80   Temp (!) 96.1 F (35.6 C) (Temporal)   Ht 5\' 3"  (1.6 m)   Wt 140 lb (63.5 kg)   SpO2 93%   BMI 24.80 kg/m  General: Awake, appears stated age Lungs: No accessory muscle use Psych: Age appropriate judgment and insight, normal affect and mood  Assessment and Plan: Anxiety - Plan: clonazePAM (KLONOPIN) 1 MG tablet, Suvorexant (BELSOMRA) 20 MG TABS  Smokes with greater than 30 pack year history - Plan: CT CHEST LUNG CA SCREEN LOW DOSE W/O CM  Klonopin prn. Try Belsomra.  Reorder CT. Counseled on tobacco cessation. F/u as originally scheduled.  The patient voiced understanding and agreement to the plan.  [place], DO 03/17/19  10:44 AM

## 2019-03-17 NOTE — Patient Instructions (Signed)
Let me know if the medication is too expensive.   Aim to do some physical exertion for 150 minutes per week. This is typically divided into 5 days per week, 30 minutes per day. The activity should be enough to get your heart rate up. Anything is better than nothing if you have time constraints.  Let me know if you need an order for the mammogram.   Let us know if you need anything.

## 2019-03-24 ENCOUNTER — Telehealth: Payer: Self-pay | Admitting: Family Medicine

## 2019-03-24 ENCOUNTER — Ambulatory Visit (HOSPITAL_BASED_OUTPATIENT_CLINIC_OR_DEPARTMENT_OTHER)
Admission: RE | Admit: 2019-03-24 | Discharge: 2019-03-24 | Disposition: A | Discharge status: Home / Self Care | Payer: Medicare HMO | Source: Ambulatory Visit | Attending: Family Medicine | Admitting: Family Medicine

## 2019-03-24 ENCOUNTER — Other Ambulatory Visit (HOSPITAL_BASED_OUTPATIENT_CLINIC_OR_DEPARTMENT_OTHER): Payer: Self-pay | Admitting: Family Medicine

## 2019-03-24 ENCOUNTER — Other Ambulatory Visit: Payer: Self-pay

## 2019-03-24 DIAGNOSIS — Z1231 Encounter for screening mammogram for malignant neoplasm of breast: Secondary | ICD-10-CM

## 2019-03-24 DIAGNOSIS — R69 Illness, unspecified: Secondary | ICD-10-CM | POA: Diagnosis not present

## 2019-03-24 DIAGNOSIS — F17210 Nicotine dependence, cigarettes, uncomplicated: Secondary | ICD-10-CM | POA: Insufficient documentation

## 2019-03-24 MED ORDER — MIRTAZAPINE 30 MG PO TABS: 30.0000 mg | ORAL_TABLET | Freq: Every day | ORAL | 3 refills | Status: DC

## 2019-03-24 NOTE — Telephone Encounter (Signed)
Patient informed of change. 

## 2019-03-24 NOTE — Telephone Encounter (Signed)
Sent in Remeron. Ty.

## 2019-03-24 NOTE — Telephone Encounter (Signed)
Belsomra for #30 was $252.00  Too expensive offer alternative

## 2019-05-04 ENCOUNTER — Encounter (HOSPITAL_BASED_OUTPATIENT_CLINIC_OR_DEPARTMENT_OTHER): Payer: Self-pay

## 2019-05-04 ENCOUNTER — Ambulatory Visit (HOSPITAL_BASED_OUTPATIENT_CLINIC_OR_DEPARTMENT_OTHER)
Admission: RE | Admit: 2019-05-04 | Discharge: 2019-05-04 | Disposition: A | Discharge status: Home / Self Care | Payer: Medicare HMO | Source: Ambulatory Visit | Attending: Family Medicine | Admitting: Family Medicine

## 2019-05-04 ENCOUNTER — Other Ambulatory Visit: Payer: Self-pay

## 2019-05-04 DIAGNOSIS — Z1231 Encounter for screening mammogram for malignant neoplasm of breast: Secondary | ICD-10-CM | POA: Diagnosis not present

## 2019-05-26 DIAGNOSIS — R69 Illness, unspecified: Secondary | ICD-10-CM | POA: Diagnosis not present

## 2019-05-26 DIAGNOSIS — Z8249 Family history of ischemic heart disease and other diseases of the circulatory system: Secondary | ICD-10-CM | POA: Diagnosis not present

## 2019-05-26 DIAGNOSIS — R030 Elevated blood-pressure reading, without diagnosis of hypertension: Secondary | ICD-10-CM | POA: Diagnosis not present

## 2019-05-26 DIAGNOSIS — Z720 Tobacco use: Secondary | ICD-10-CM | POA: Diagnosis not present

## 2019-05-26 DIAGNOSIS — Z008 Encounter for other general examination: Secondary | ICD-10-CM | POA: Diagnosis not present

## 2019-05-26 DIAGNOSIS — Z809 Family history of malignant neoplasm, unspecified: Secondary | ICD-10-CM | POA: Diagnosis not present

## 2019-07-29 IMAGING — CR DG WRIST COMPLETE 3+V*L*
4 series · 4 of 4 positions shown · non-contrast
Comparison: None.

CLINICAL DATA: Recent fall with wrist pain, initial encounter

EXAM:
LEFT WRIST - COMPLETE 3+ VIEW

[x wrist pa left]
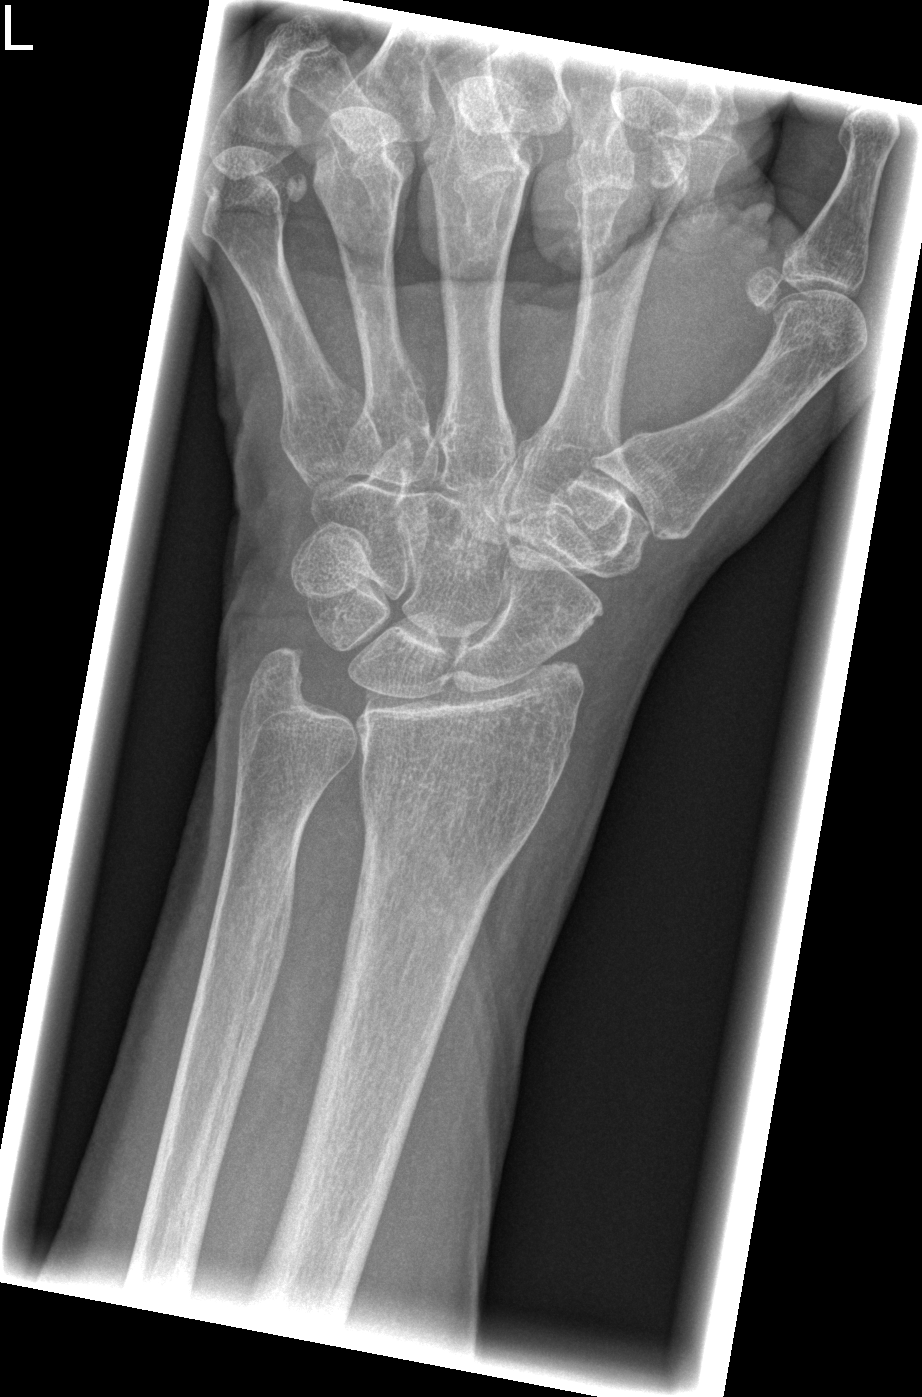

[x wrist obl left]
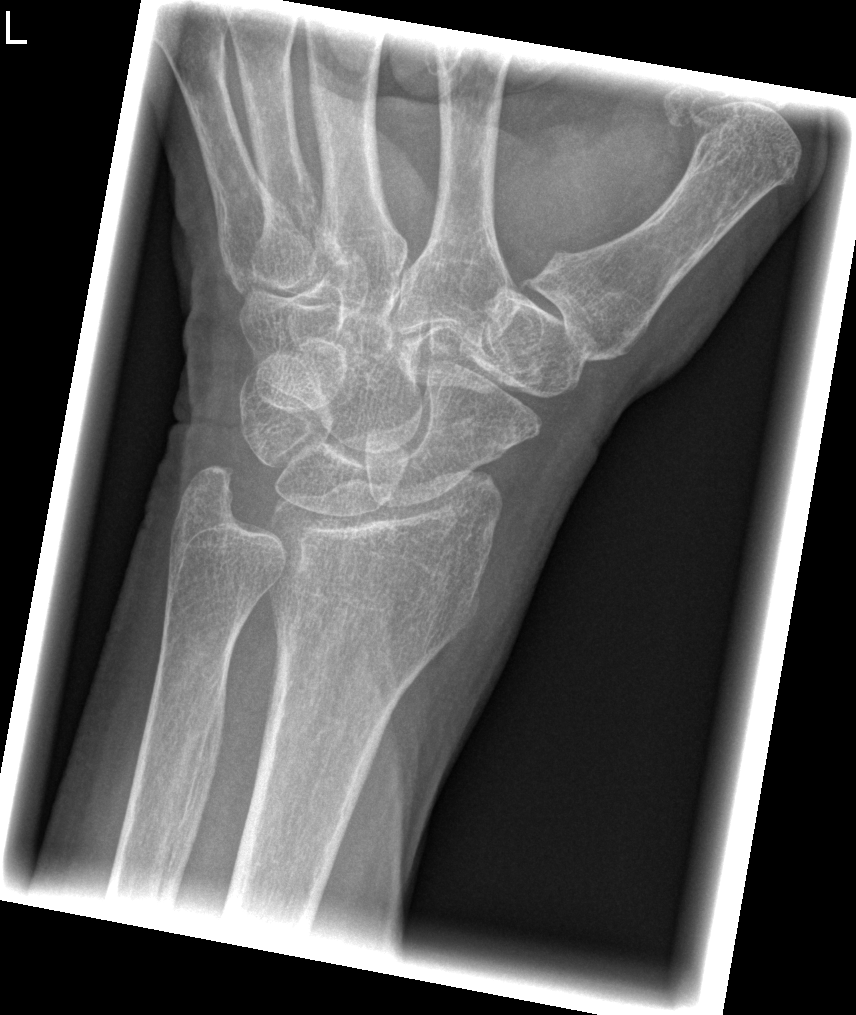

[x wrist lat left]
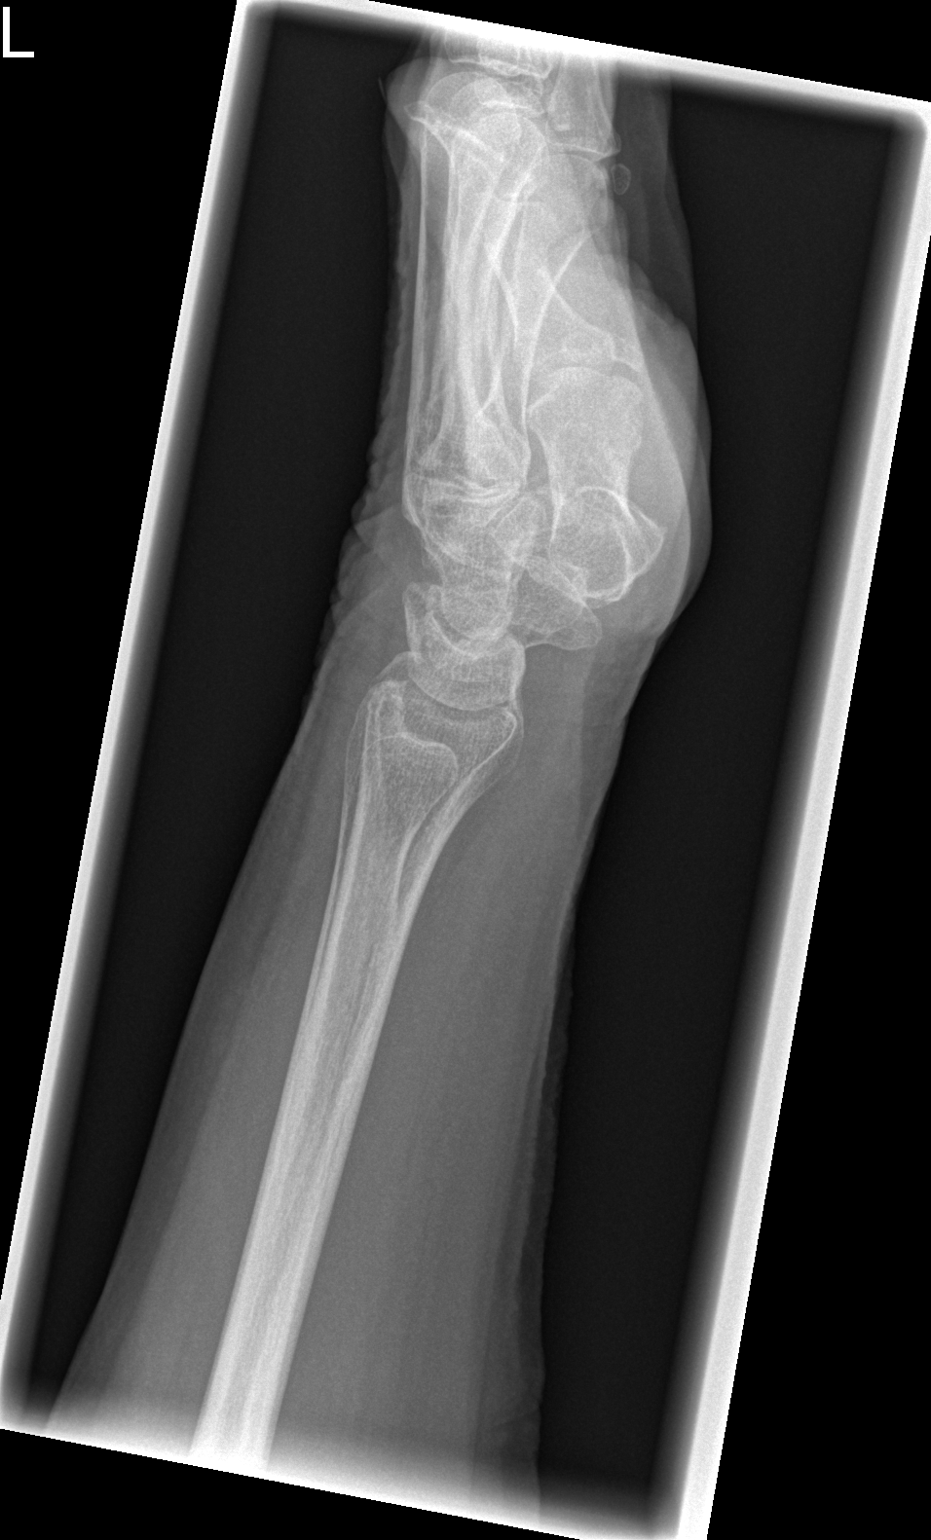

[x navicular]
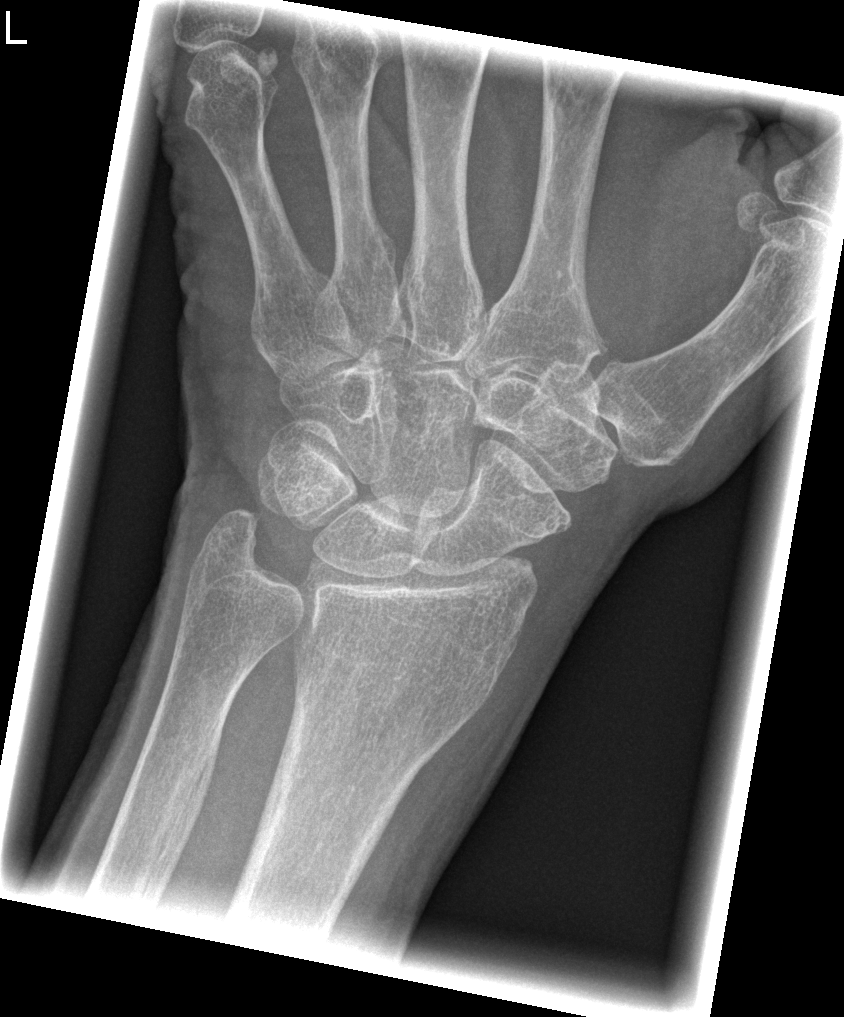

[4 of 4 positions shown; findings below may reference images not displayed]

FINDINGS: There is no evidence of fracture or dislocation. There is no
evidence of arthropathy or other focal bone abnormality. Soft
tissues are unremarkable.
IMPRESSION: No acute abnormality noted.

## 2019-11-25 ENCOUNTER — Other Ambulatory Visit: Payer: Self-pay | Admitting: Family Medicine

## 2019-11-25 DIAGNOSIS — F419 Anxiety disorder, unspecified: Secondary | ICD-10-CM

## 2019-11-26 NOTE — Telephone Encounter (Signed)
Plz sched her 6 mo f/u. Ty.

## 2019-11-26 NOTE — Telephone Encounter (Signed)
Requesting: klonopin  Contract:n/a UDS:n/a Last Visit:03/17/19 Next Visit:n/a Last Refill:03/17/19  Please Advise

## 2020-01-18 ENCOUNTER — Other Ambulatory Visit: Payer: Self-pay | Admitting: Family Medicine

## 2020-01-18 DIAGNOSIS — F419 Anxiety disorder, unspecified: Secondary | ICD-10-CM

## 2020-01-18 NOTE — Telephone Encounter (Signed)
Requesting: clonazepam 1mg  Contract: None [id number]:[id number] Last Visit: 03/17/2019 Next Visit: None Last Refill: 11/26/2019 #30 and 0RF Pt sig: 1/2 to 1 tablet bid  Please Advise

## 2020-01-18 NOTE — Telephone Encounter (Signed)
She is due for her f/u, plz sched. Ty.

## 2020-01-18 NOTE — Telephone Encounter (Signed)
Scheduled appointment for in the morning.

## 2020-01-19 ENCOUNTER — Ambulatory Visit (INDEPENDENT_AMBULATORY_CARE_PROVIDER_SITE_OTHER): Payer: Medicare HMO | Admitting: Family Medicine

## 2020-01-19 ENCOUNTER — Encounter: Payer: Self-pay | Admitting: Family Medicine

## 2020-01-19 ENCOUNTER — Other Ambulatory Visit: Payer: Self-pay

## 2020-01-19 VITALS — BP 142/86 | HR 71 | Temp 97.8°F | Ht 63.0 in | Wt 138.5 lb

## 2020-01-19 DIAGNOSIS — M810 Age-related osteoporosis without current pathological fracture: Secondary | ICD-10-CM | POA: Diagnosis not present

## 2020-01-19 DIAGNOSIS — Z9189 Other specified personal risk factors, not elsewhere classified: Secondary | ICD-10-CM | POA: Diagnosis not present

## 2020-01-19 DIAGNOSIS — R69 Illness, unspecified: Secondary | ICD-10-CM | POA: Diagnosis not present

## 2020-01-19 DIAGNOSIS — F419 Anxiety disorder, unspecified: Secondary | ICD-10-CM | POA: Diagnosis not present

## 2020-01-19 LAB — COMPREHENSIVE METABOLIC PANEL
ALT: 9 U/L (ref 0–35)
AST: 17 U/L (ref 0–37)
Albumin: 4.2 g/dL (ref 3.5–5.2)
Alkaline Phosphatase: 59 U/L (ref 39–117)
BUN: 12 mg/dL (ref 6–23)
CO2: 26 mEq/L (ref 19–32)
Calcium: 9.2 mg/dL (ref 8.4–10.5)
Chloride: 100 mEq/L (ref 96–112)
Creatinine, Ser: 0.87 mg/dL (ref 0.40–1.20)
GFR: 67.99 mL/min (ref >60.00)
Glucose, Bld: 89 mg/dL (ref 70–99)
Potassium: 4.3 mEq/L (ref 3.5–5.1)
Sodium: 133 mEq/L — ABNORMAL LOW (ref 135–145)
Total Bilirubin: 0.4 mg/dL (ref 0.2–1.2)
Total Protein: 6.5 g/dL (ref 6.0–8.3)

## 2020-01-19 LAB — VITAMIN D 25 HYDROXY (VIT D DEFICIENCY, FRACTURES): VITD: 29.92 ng/mL — ABNORMAL LOW (ref 30.00–100.00)

## 2020-01-19 LAB — LIPID PANEL
Cholesterol: 208 mg/dL — ABNORMAL HIGH (ref 0–200)
HDL: 70.4 mg/dL (ref >39.00)
LDL Cholesterol: 121 mg/dL — ABNORMAL HIGH (ref 0–99)
NonHDL: 137.95
Total CHOL/HDL Ratio: 3
Triglycerides: 86 mg/dL (ref 0.0–149.0)
VLDL: 17.2 mg/dL (ref 0.0–40.0)

## 2020-01-19 MED ORDER — LOVASTATIN 10 MG PO TABS: 10.0000 mg | ORAL_TABLET | Freq: Every day | ORAL | 3 refills | Status: DC

## 2020-01-19 NOTE — Patient Instructions (Addendum)
We will check with insurance regarding the Prolia injections for your bone health.   Continue the Vit D and calcium.  Keep the diet clean and stay active.   Give [place] 2-3 business days to get the results of your labs back.   Let us know if you need anything.

## 2020-01-19 NOTE — Progress Notes (Signed)
Chief Complaint  Patient presents with  . Follow-up    Subjective Sharon Schmidt presents for f/u anxiety. Pt is currently being treated with Klonopin 0.5 mg prn.  Reports doing OK since treatment. No thoughts of harming self or others. No self-medication with alcohol, prescription drugs or illicit drugs. Pt is not following with a counselor/psychologist.  Osteoporosis Failed Fosamax 2/2 GI AE's. She is taking Ca and Vit D. She is walking. She is still smoking. No recent falls or fractures.    Hyperlipidemia Patient presents for hyperlipidemia follow up. Currently not on treatment as she has not done well w statins.  She is adhering to a healthy diet. Exercise: some walking The patient is not known to have coexisting coronary artery disease.  Past Medical History:  Diagnosis Date  . 10 year risk of MI or stroke 7.5% or greater 08/08/2016  . Allergy   . Anxiety   . Cataracts, bilateral   . Coronary atherosclerosis 08/08/2016  . Depression   . Osteoporosis   . Tobacco abuse 08/08/2016   Allergies as of 01/19/2020      Reactions   Augmentin [amoxicillin-pot Clavulanate] Nausea Only   Fosamax [alendronate]    N/V/stomach upset   Statins    Myalgias      Medication List       Accurate as of January 19, 2020 11:42 AM. If you have any questions, ask your nurse or doctor.        STOP taking these medications   mirtazapine 30 MG tablet Commonly known as: REMERON Stopped by: Shelda Pal, DO     TAKE these medications   clonazePAM 1 MG tablet Commonly known as: KLONOPIN TAKE 1/2 TO 1 (ONE-HALF TO ONE) TABLET BY MOUTH TWICE DAILY   ibuprofen 200 MG tablet Commonly known as: ADVIL Take 200 mg by mouth at bedtime as needed.   ICY HOT EX Apply topically.   lovastatin 10 MG tablet Commonly known as: MEVACOR Take 1 tablet (10 mg total) by mouth at bedtime. Started by: Shelda Pal, DO   Magnesium 250 MG Tabs Take by mouth.   NON FORMULARY    OVER THE COUNTER MEDICATION Multivitamin Chewable-Take 1 chewable daily.   OVER THE COUNTER MEDICATION Antiacid-Take 1 tablet by mouth as needed.   VITAMIN D PO Take by mouth. Take 1 gel cap by mouth daily.   vitamin E 180 MG (400 UNITS) capsule Take 800 Units by mouth daily.       Exam BP (!) 142/86 (BP Location: Left Arm, Patient Position: Sitting, Cuff Size: Normal)   Pulse 71   Temp 97.8 F (36.6 C) (Oral)   Ht 5\' 3"  (1.6 m)   Wt 138 lb 8 oz (62.8 kg)   SpO2 98%   BMI 24.53 kg/m  General:  well developed, well nourished, in no apparent distress Lungs:  No respiratory distress Psych: well oriented with normal range of affect and age-appropriate judgement/insight, alert and oriented x4.  Assessment and Plan  Anxiety  10 year risk of MI or stroke 7.5% or greater - Plan: Lipid panel, Comprehensive metabolic panel, lovastatin (MEVACOR) 10 MG tablet  Age-related osteoporosis without current pathological fracture - Plan: VITAMIN D 25 Hydroxy (Vit-D Deficiency, Fractures)  1.  Continue Klonopin as needed.  She is doing well to wean down. 2.  Try low-dose of Mevacor.  Counseled on tobacco cessation.  Counseled on diet and exercise. 3.  Continue calcium and vitamin D.  Counseled on weightbearing exercise.  We  will look into Prolia injections.  She has failed Fosamax due to GI adverse effects. F/u in 6 mo. The patient voiced understanding and agreement to the plan.  [place], DO 01/19/20 11:42 AM

## 2020-02-17 ENCOUNTER — Other Ambulatory Visit: Payer: Self-pay | Admitting: Family Medicine

## 2020-02-17 DIAGNOSIS — F419 Anxiety disorder, unspecified: Secondary | ICD-10-CM

## 2020-02-17 NOTE — Telephone Encounter (Signed)
Requesting: clonazepam 1mg  Contract: None UDS: None Last Visit:  01/19/2020 Next Visit: None Last Refill: 01/18/2020 #15 and 0RF Pt sig: 1/2 to 1 tab bid prn  Please Advise

## 2020-03-07 ENCOUNTER — Other Ambulatory Visit: Payer: Self-pay | Admitting: Family Medicine

## 2020-03-07 ENCOUNTER — Telehealth: Payer: Self-pay | Admitting: Family Medicine

## 2020-03-07 DIAGNOSIS — F17210 Nicotine dependence, cigarettes, uncomplicated: Secondary | ICD-10-CM

## 2020-03-07 MED ORDER — PITAVASTATIN CALCIUM 1 MG PO TABS: 1.0000 mg | ORAL_TABLET | Freq: Every day | ORAL | 1 refills | Status: DC

## 2020-03-07 NOTE — Telephone Encounter (Signed)
Yes all these she has failed. Ok to try another new one.

## 2020-03-07 NOTE — Telephone Encounter (Signed)
Patient started lovastatin and took for 2 weeks. Caused leg cramps. Advise on trying something else.Marland KitchenMarland KitchenMarland Kitchen

## 2020-03-07 NOTE — Telephone Encounter (Signed)
Will try lowest dose of another one. She has failed simvastatin/Zocor and rosuvastatin/Crestor, correct?

## 2020-03-14 DIAGNOSIS — Z0100 Encounter for examination of eyes and vision without abnormal findings: Secondary | ICD-10-CM | POA: Diagnosis not present

## 2020-03-14 DIAGNOSIS — H5203 Hypermetropia, bilateral: Secondary | ICD-10-CM | POA: Diagnosis not present

## 2020-03-15 ENCOUNTER — Ambulatory Visit (HOSPITAL_BASED_OUTPATIENT_CLINIC_OR_DEPARTMENT_OTHER): Payer: Medicare HMO

## 2020-03-27 ENCOUNTER — Encounter: Payer: Self-pay | Admitting: Family Medicine

## 2020-03-27 ENCOUNTER — Other Ambulatory Visit: Payer: Self-pay | Admitting: Family Medicine

## 2020-03-27 ENCOUNTER — Ambulatory Visit (HOSPITAL_BASED_OUTPATIENT_CLINIC_OR_DEPARTMENT_OTHER)
Admission: RE | Admit: 2020-03-27 | Discharge: 2020-03-27 | Disposition: A | Discharge status: Home / Self Care | Payer: Medicare HMO | Source: Ambulatory Visit | Attending: Family Medicine | Admitting: Family Medicine

## 2020-03-27 ENCOUNTER — Other Ambulatory Visit: Payer: Self-pay

## 2020-03-27 DIAGNOSIS — F419 Anxiety disorder, unspecified: Secondary | ICD-10-CM

## 2020-03-27 DIAGNOSIS — R69 Illness, unspecified: Secondary | ICD-10-CM | POA: Diagnosis not present

## 2020-03-27 DIAGNOSIS — Z87891 Personal history of nicotine dependence: Secondary | ICD-10-CM | POA: Diagnosis not present

## 2020-03-27 DIAGNOSIS — F17210 Nicotine dependence, cigarettes, uncomplicated: Secondary | ICD-10-CM | POA: Insufficient documentation

## 2020-03-27 DIAGNOSIS — I700 Atherosclerosis of aorta: Secondary | ICD-10-CM | POA: Insufficient documentation

## 2020-03-27 DIAGNOSIS — J439 Emphysema, unspecified: Secondary | ICD-10-CM | POA: Insufficient documentation

## 2020-03-27 MED ORDER — PITAVASTATIN CALCIUM 1 MG PO TABS: 1.0000 mg | ORAL_TABLET | Freq: Every day | ORAL | 3 refills | Status: DC

## 2020-03-27 NOTE — Telephone Encounter (Signed)
Last Refill---#15 no refills on 02/17/2020 Last OV---01/19/2020 No CSC//no UDS

## 2020-03-27 NOTE — Telephone Encounter (Signed)
Requesting: clonazepam 1mg  Contract: None UDS: None Last Visit: 01/19/2020 Next Visit: None Last Refill: 02/17/2020 #15 and 0RF  Please Advise

## 2020-03-29 ENCOUNTER — Telehealth: Payer: Self-pay

## 2020-03-29 NOTE — Telephone Encounter (Signed)
PA approved. Effective 03/04/2020 to 03/03/2021.  

## 2020-03-29 NOTE — Telephone Encounter (Signed)
PA initiated via Covermymeds; KEY: [id number]. Awaiting determination.

## 2020-04-10 DIAGNOSIS — R69 Illness, unspecified: Secondary | ICD-10-CM | POA: Diagnosis not present

## 2020-04-10 DIAGNOSIS — R030 Elevated blood-pressure reading, without diagnosis of hypertension: Secondary | ICD-10-CM | POA: Diagnosis not present

## 2020-04-10 DIAGNOSIS — Z809 Family history of malignant neoplasm, unspecified: Secondary | ICD-10-CM | POA: Diagnosis not present

## 2020-04-10 DIAGNOSIS — Z791 Long term (current) use of non-steroidal anti-inflammatories (NSAID): Secondary | ICD-10-CM | POA: Diagnosis not present

## 2020-04-10 DIAGNOSIS — Z8249 Family history of ischemic heart disease and other diseases of the circulatory system: Secondary | ICD-10-CM | POA: Diagnosis not present

## 2020-04-10 DIAGNOSIS — E785 Hyperlipidemia, unspecified: Secondary | ICD-10-CM | POA: Diagnosis not present

## 2020-04-10 DIAGNOSIS — Z008 Encounter for other general examination: Secondary | ICD-10-CM | POA: Diagnosis not present

## 2020-04-10 DIAGNOSIS — K219 Gastro-esophageal reflux disease without esophagitis: Secondary | ICD-10-CM | POA: Diagnosis not present

## 2020-07-10 ENCOUNTER — Other Ambulatory Visit (HOSPITAL_BASED_OUTPATIENT_CLINIC_OR_DEPARTMENT_OTHER): Payer: Self-pay | Admitting: Family Medicine

## 2020-07-10 DIAGNOSIS — Z1231 Encounter for screening mammogram for malignant neoplasm of breast: Secondary | ICD-10-CM

## 2020-07-11 ENCOUNTER — Ambulatory Visit (HOSPITAL_BASED_OUTPATIENT_CLINIC_OR_DEPARTMENT_OTHER)
Admission: RE | Admit: 2020-07-11 | Discharge: 2020-07-11 | Disposition: A | Discharge status: Home / Self Care | Payer: Medicare HMO | Source: Ambulatory Visit | Attending: Family Medicine | Admitting: Family Medicine

## 2020-07-11 ENCOUNTER — Encounter (HOSPITAL_BASED_OUTPATIENT_CLINIC_OR_DEPARTMENT_OTHER): Payer: Self-pay

## 2020-07-11 ENCOUNTER — Other Ambulatory Visit: Payer: Self-pay

## 2020-07-11 DIAGNOSIS — Z1231 Encounter for screening mammogram for malignant neoplasm of breast: Secondary | ICD-10-CM | POA: Diagnosis not present

## 2020-08-07 ENCOUNTER — Encounter: Payer: Self-pay | Admitting: Family Medicine

## 2020-08-07 ENCOUNTER — Other Ambulatory Visit: Payer: Self-pay

## 2020-08-07 ENCOUNTER — Ambulatory Visit (INDEPENDENT_AMBULATORY_CARE_PROVIDER_SITE_OTHER): Payer: Medicare HMO | Admitting: Family Medicine

## 2020-08-07 VITALS — BP 122/78 | HR 70 | Temp 98.4°F | Ht 63.0 in | Wt 141.2 lb

## 2020-08-07 DIAGNOSIS — J439 Emphysema, unspecified: Secondary | ICD-10-CM

## 2020-08-07 DIAGNOSIS — M810 Age-related osteoporosis without current pathological fracture: Secondary | ICD-10-CM

## 2020-08-07 DIAGNOSIS — R5383 Other fatigue: Secondary | ICD-10-CM

## 2020-08-07 DIAGNOSIS — I700 Atherosclerosis of aorta: Secondary | ICD-10-CM

## 2020-08-07 DIAGNOSIS — R2689 Other abnormalities of gait and mobility: Secondary | ICD-10-CM

## 2020-08-07 DIAGNOSIS — Z0000 Encounter for general adult medical examination without abnormal findings: Secondary | ICD-10-CM | POA: Diagnosis not present

## 2020-08-07 NOTE — Progress Notes (Signed)
Chief Complaint  Patient presents with  . Annual Exam     Well Woman Sharon Schmidt is here for a complete physical.   Her last physical was >1 year ago.  Current diet: in general, diet could be better. Current exercise: walking. Weight is stable and she confirms daytime fatigue. Seatbelt? Yes  Health Maintenance Colonoscopy- Yes Shingrix- Yes Lung cancer screening- Yes DEXA- Due Mammogram- Yes Tetanus- Yes Pneumonia- Yes Hep C screen- Yes    Swelling Hands, feet, and ankles have been swelling for 6 mo. This is getting worse. No pain. She keeps the diet void of extra salt, but has not been very healthy with lots of carbs. Walking for exercise. No known hx of heart/liver/kidney failure. No calf pain.   Fatigue/balance issues Over past 6 mo, has felt unsteady on feet. She has cataracts, worse on R, but no new vision changes. Has not fallen or lost consciousness, no dizziness, weakness, numbness/tingling, trouble swallowing, difficulty w speech.   Past Medical History:  Diagnosis Date  . 10 year risk of MI or stroke 7.5% or greater 08/08/2016  . Allergy   . Anxiety   . Aortic atherosclerosis ([place])   . Cataracts, bilateral   . Coronary atherosclerosis 08/08/2016  . Depression   . Emphysema lung ([place])   . Osteoporosis   . Tobacco abuse 08/08/2016     Past Surgical History:  Procedure Laterality Date  . BREAST BIOPSY Left 09/24/2017   benign  . CESAREAN SECTION    . KNEE SURGERY     x2  . OVARIAN CYST REMOVAL    . TONSILLECTOMY      Medications  Current Outpatient Medications on File Prior to Visit  Medication Sig Dispense Refill  . Cholecalciferol (VITAMIN D PO) Take by mouth. Take 1 gel cap by mouth daily.    . clonazePAM (KLONOPIN) 1 MG tablet TAKE 1/2 TO 1 (ONE-HALF TO ONE) TABLET BY MOUTH TWICE DAILY 30 tablet 5  . ibuprofen (ADVIL,MOTRIN) 200 MG tablet Take 200 mg by mouth at bedtime as needed.    . Magnesium 250 MG TABS Take by mouth.    . Menthol, Topical  Analgesic, (ICY HOT EX) Apply topically.    . NON FORMULARY     . OVER THE COUNTER MEDICATION Multivitamin Chewable-Take 1 chewable daily.    Marland Kitchen OVER THE COUNTER MEDICATION Antiacid-Take 1 tablet by mouth as needed.    . Pitavastatin Calcium 1 MG TABS Take 1 tablet (1 mg total) by mouth daily. 90 tablet 3  . vitamin E 400 UNIT capsule Take 800 Units by mouth daily.      Allergies Allergies  Allergen Reactions  . Augmentin [Amoxicillin-Pot Clavulanate] Nausea Only  . Fosamax [Alendronate]     N/V/stomach upset  . Statins     Myalgias    Review of Systems: Constitutional:  no fevers Eye:  no recent significant change in vision Ears:  No changes in hearing Nose/Mouth/Throat:  no complaints of nasal congestion, no sore throat Cardiovascular: no chest pain Respiratory:  No shortness of breath Gastrointestinal:  No change in bowel habits GU:  Female: negative for dysuria Integumentary:  no abnormal skin lesions reported Neurologic:  no headaches Endocrine:  denies unexplained weight changes  Exam BP 122/78 (BP Location: Left Arm, Patient Position: Sitting, Cuff Size: Normal)   Pulse 70   Temp 98.4 F (36.9 C) (Oral)   Ht 5\' 3"  (1.6 m)   Wt 141 lb 4 oz (64.1 kg)   SpO2 94%  BMI 25.02 kg/m  General:  well developed, well nourished, in no apparent distress Skin:  no significant moles, warts, or growths Head:  no masses, lesions, or tenderness Eyes:  pupils equal and round, sclera anicteric without injection Ears:  canals without lesions, TMs shiny without retraction, no obvious effusion, no erythema Nose:  nares patent, septum midline, mucosa normal, and no drainage or sinus tenderness Throat/Pharynx:  lips and gingiva without lesion; tongue and uvula midline; non-inflamed pharynx; no exudates or postnasal drainage Neck: neck supple without adenopathy, thyromegaly, or masses Lungs:  clear to auscultation, breath sounds equal bilaterally, no respiratory distress Cardio:   regular rate and rhythm, no bruits or LE edema Abdomen:  abdomen soft, nontender; bowel sounds normal; no masses or organomegaly Genital: Deferred Neuro:  gait normal; deep tendon reflexes normal and symmetric Psych: well oriented with normal range of affect and appropriate judgment/insight  Assessment and Plan  Well adult exam  Aortic atherosclerosis ([place]) - Plan: Comprehensive metabolic panel, Lipid panel  Pulmonary emphysema, unspecified emphysema type ([place])  Age-related osteoporosis without current pathological fracture - Plan: VITAMIN D 25 Hydroxy (Vit-D Deficiency, Fractures), DG Bone Density  Fatigue, unspecified type - Plan: TSH, CBC  Balance problem - Plan: B12   Well 70 y.o. female. Counseled on diet and exercise. Other orders as above. New problem, uncertain prognosis. Ck labs. If neg, will refer to physical therapy vs neuro. New problem, chronic, will rec elevating legs/hands, decrease sodium in reg diet. I think her regular food, while she is not adding salt, is carrying enough sodium to affect things. She will start wrapping hands/feet w ace bandage to help with compression. Ck labs.  Follow up in 6 mo or prn. The patient voiced understanding and agreement to the plan.  [place], DO 08/07/20 1:15 PM

## 2020-08-07 NOTE — Patient Instructions (Addendum)
Give [place] 2-3 business days to get the results of your labs back.   If you do not hear anything about your bone density scan in the next week, call our office and ask for an update.  For the swelling in your lower extremities, be sure to elevate your legs when able, mind the salt intake, stay physically active and consider wearing compression stockings.  If the labs are normal, I will get you set up with physical therapy for the balance barring a change.   Let us know if you need anything.

## 2020-08-08 ENCOUNTER — Other Ambulatory Visit: Payer: Medicare HMO

## 2020-08-08 DIAGNOSIS — E538 Deficiency of other specified B group vitamins: Secondary | ICD-10-CM | POA: Diagnosis not present

## 2020-08-08 LAB — CBC
HCT: 36.9 % (ref 36.0–46.0)
Hemoglobin: 12.7 g/dL (ref 12.0–15.0)
MCHC: 34.5 g/dL (ref 30.0–36.0)
MCV: 91 fl (ref 78.0–100.0)
Platelets: 284 10*3/uL (ref 150.0–400.0)
RBC: 4.06 Mil/uL (ref 3.87–5.11)
RDW: 14 % (ref 11.5–15.5)
WBC: 9.5 10*3/uL (ref 4.0–10.5)

## 2020-08-08 LAB — VITAMIN B12: Vitamin B-12: 165 pg/mL — ABNORMAL LOW (ref 211–911)

## 2020-08-08 LAB — COMPREHENSIVE METABOLIC PANEL
ALT: 15 U/L (ref 0–35)
AST: 20 U/L (ref 0–37)
Albumin: 4.2 g/dL (ref 3.5–5.2)
Alkaline Phosphatase: 51 U/L (ref 39–117)
BUN: 10 mg/dL (ref 6–23)
CO2: 25 mEq/L (ref 19–32)
Calcium: 9.1 mg/dL (ref 8.4–10.5)
Chloride: 103 mEq/L (ref 96–112)
Creatinine, Ser: 0.81 mg/dL (ref 0.40–1.20)
GFR: 73.79 mL/min (ref >60.00)
Glucose, Bld: 63 mg/dL — ABNORMAL LOW (ref 70–99)
Potassium: 3.7 mEq/L (ref 3.5–5.1)
Sodium: 138 mEq/L (ref 135–145)
Total Bilirubin: 0.5 mg/dL (ref 0.2–1.2)
Total Protein: 6.4 g/dL (ref 6.0–8.3)

## 2020-08-08 LAB — VITAMIN D 25 HYDROXY (VIT D DEFICIENCY, FRACTURES): VITD: 32.62 ng/mL (ref 30.00–100.00)

## 2020-08-08 LAB — LIPID PANEL
Cholesterol: 156 mg/dL (ref 0–200)
HDL: 67.3 mg/dL (ref >39.00)
LDL Cholesterol: 75 mg/dL (ref 0–99)
NonHDL: 89.07
Total CHOL/HDL Ratio: 2
Triglycerides: 71 mg/dL (ref 0.0–149.0)
VLDL: 14.2 mg/dL (ref 0.0–40.0)

## 2020-08-08 LAB — TSH: TSH: 1.42 u[IU]/mL (ref 0.35–4.50)

## 2020-08-08 NOTE — Progress Notes (Unsigned)
rinsic factor

## 2020-08-15 ENCOUNTER — Ambulatory Visit (HOSPITAL_BASED_OUTPATIENT_CLINIC_OR_DEPARTMENT_OTHER)
Admission: RE | Admit: 2020-08-15 | Discharge: 2020-08-15 | Disposition: A | Discharge status: Home / Self Care | Payer: Medicare HMO | Source: Ambulatory Visit | Attending: Family Medicine | Admitting: Family Medicine

## 2020-08-15 ENCOUNTER — Other Ambulatory Visit: Payer: Self-pay

## 2020-08-15 DIAGNOSIS — Z780 Asymptomatic menopausal state: Secondary | ICD-10-CM | POA: Diagnosis not present

## 2020-08-15 DIAGNOSIS — M810 Age-related osteoporosis without current pathological fracture: Secondary | ICD-10-CM | POA: Diagnosis not present

## 2020-08-15 LAB — INTRINSIC FACTOR ANTIBODIES: Intrinsic Factor: NEGATIVE

## 2020-08-16 ENCOUNTER — Other Ambulatory Visit: Payer: Self-pay | Admitting: Family Medicine

## 2020-08-16 DIAGNOSIS — E538 Deficiency of other specified B group vitamins: Secondary | ICD-10-CM

## 2020-09-12 DIAGNOSIS — H25013 Cortical age-related cataract, bilateral: Secondary | ICD-10-CM | POA: Diagnosis not present

## 2020-09-12 DIAGNOSIS — H2513 Age-related nuclear cataract, bilateral: Secondary | ICD-10-CM | POA: Diagnosis not present

## 2020-09-12 DIAGNOSIS — H18523 Epithelial (juvenile) corneal dystrophy, bilateral: Secondary | ICD-10-CM | POA: Diagnosis not present

## 2020-09-13 ENCOUNTER — Other Ambulatory Visit (INDEPENDENT_AMBULATORY_CARE_PROVIDER_SITE_OTHER): Payer: Medicare HMO

## 2020-09-13 ENCOUNTER — Other Ambulatory Visit: Payer: Self-pay

## 2020-09-13 DIAGNOSIS — E538 Deficiency of other specified B group vitamins: Secondary | ICD-10-CM | POA: Diagnosis not present

## 2020-09-13 LAB — VITAMIN B12: Vitamin B-12: 403 pg/mL (ref 211–911)

## 2020-10-17 DIAGNOSIS — D224 Melanocytic nevi of scalp and neck: Secondary | ICD-10-CM | POA: Diagnosis not present

## 2020-10-17 DIAGNOSIS — L821 Other seborrheic keratosis: Secondary | ICD-10-CM | POA: Diagnosis not present

## 2020-10-17 DIAGNOSIS — L820 Inflamed seborrheic keratosis: Secondary | ICD-10-CM | POA: Diagnosis not present

## 2020-11-13 ENCOUNTER — Ambulatory Visit (INDEPENDENT_AMBULATORY_CARE_PROVIDER_SITE_OTHER): Payer: Medicare HMO

## 2020-11-13 VITALS — Ht 63.0 in | Wt 141.0 lb

## 2020-11-13 DIAGNOSIS — Z0000 Encounter for general adult medical examination without abnormal findings: Secondary | ICD-10-CM

## 2020-11-13 NOTE — Patient Instructions (Signed)
Sharon Schmidt , Thank you for taking time to complete your Medicare Wellness Visit. I appreciate your ongoing commitment to your health goals. Please review the following plan we discussed and let me know if I can assist you in the future.   Screening recommendations/referrals: Colonoscopy: Completed 11/20/2017-Due 11/21/2027 Mammogram: Completed 07/11/2020-Due 07/11/2021 Bone Density: Completed 08/15/2020-Due 08/17/2021 Recommended yearly ophthalmology/optometry visit for glaucoma screening and checkup Recommended yearly dental visit for hygiene and checkup  Vaccinations: Influenza vaccine: Declined Pneumococcal vaccine: Up to date Tdap vaccine: Up to date Shingles vaccine: Completed vaccines   Covid-19:Declined booster  Advanced directives: Please bring a copy for your chart  Conditions/risks identified: See problem list  Next appointment: Follow up in one year for your annual wellness visit 11/15/2021 @ 11:00   Preventive Care 70 Years and Older, Female Preventive care refers to lifestyle choices and visits with your health care provider that can promote health and wellness. What does preventive care include? A yearly physical exam. This is also called an annual well check. Dental exams once or twice a year. Routine eye exams. Ask your health care provider how often you should have your eyes checked. Personal lifestyle choices, including: Daily care of your teeth and gums. Regular physical activity. Eating a healthy diet. Avoiding tobacco and drug use. Limiting alcohol use. Practicing safe sex. Taking low-dose aspirin every day. Taking vitamin and mineral supplements as recommended by your health care provider. What happens during an annual well check? The services and screenings done by your health care provider during your annual well check will depend on your age, overall health, lifestyle risk factors, and family history of disease. Counseling  Your health care provider may ask  you questions about your: Alcohol use. Tobacco use. Drug use. Emotional well-being. Home and relationship well-being. Sexual activity. Eating habits. History of falls. Memory and ability to understand (cognition). Work and work Statistician. Reproductive health. Screening  You may have the following tests or measurements: Height, weight, and BMI. Blood pressure. Lipid and cholesterol levels. These may be checked every 5 years, or more frequently if you are over 23 years old. Skin check. Lung cancer screening. You may have this screening every year starting at age 70 if you have a 30-pack-year history of smoking and currently smoke or have quit within the past 15 years. Fecal occult blood test (FOBT) of the stool. You may have this test every year starting at age 70. Flexible sigmoidoscopy or colonoscopy. You may have a sigmoidoscopy every 5 years or a colonoscopy every 10 years starting at age 19. Hepatitis C blood test. Hepatitis B blood test. Sexually transmitted disease (STD) testing. Diabetes screening. This is done by checking your blood sugar (glucose) after you have not eaten for a while (fasting). You may have this done every 1-3 years. Bone density scan. This is done to screen for osteoporosis. You may have this done starting at age 70. Mammogram. This may be done every 1-2 years. Talk to your health care provider about how often you should have regular mammograms. Talk with your health care provider about your test results, treatment options, and if necessary, the need for more tests. Vaccines  Your health care provider may recommend certain vaccines, such as: Influenza vaccine. This is recommended every year. Tetanus, diphtheria, and acellular pertussis (Tdap, Td) vaccine. You may need a Td booster every 10 years. Zoster vaccine. You may need this after age 86. Pneumococcal 13-valent conjugate (PCV13) vaccine. One dose is recommended after age 25. Pneumococcal  polysaccharide (PPSV23) vaccine. One dose is recommended after age 30. Talk to your health care provider about which screenings and vaccines you need and how often you need them. This information is not intended to replace advice given to you by your health care provider. Make sure you discuss any questions you have with your health care provider. Document Released: 03/17/2015 Document Revised: 11/08/2015 Document Reviewed: 12/20/2014 Elsevier Interactive Patient Education  2017 [employer] Prevention in the Home Falls can cause injuries. They can happen to people of all ages. There are many things you can do to make your home safe and to help prevent falls. What can I do on the outside of my home? Regularly fix the edges of walkways and driveways and fix any cracks. Remove anything that might make you trip as you walk through a door, such as a raised step or threshold. Trim any bushes or trees on the path to your home. Use bright outdoor lighting. Clear any walking paths of anything that might make someone trip, such as rocks or tools. Regularly check to see if handrails are loose or broken. Make sure that both sides of any steps have handrails. Any raised decks and porches should have guardrails on the edges. Have any leaves, snow, or ice cleared regularly. Use sand or salt on walking paths during winter. Clean up any spills in your garage right away. This includes oil or grease spills. What can I do in the bathroom? Use night lights. Install grab bars by the toilet and in the tub and shower. Do not use towel bars as grab bars. Use non-skid mats or decals in the tub or shower. If you need to sit down in the shower, use a plastic, non-slip stool. Keep the floor dry. Clean up any water that spills on the floor as soon as it happens. Remove soap buildup in the tub or shower regularly. Attach bath mats securely with double-sided non-slip rug tape. Do not have throw rugs and other  things on the floor that can make you trip. What can I do in the bedroom? Use night lights. Make sure that you have a light by your bed that is easy to reach. Do not use any sheets or blankets that are too big for your bed. They should not hang down onto the floor. Have a firm chair that has side arms. You can use this for support while you get dressed. Do not have throw rugs and other things on the floor that can make you trip. What can I do in the kitchen? Clean up any spills right away. Avoid walking on wet floors. Keep items that you use a lot in easy-to-reach places. If you need to reach something above you, use a strong step stool that has a grab bar. Keep electrical cords out of the way. Do not use floor polish or wax that makes floors slippery. If you must use wax, use non-skid floor wax. Do not have throw rugs and other things on the floor that can make you trip. What can I do with my stairs? Do not leave any items on the stairs. Make sure that there are handrails on both sides of the stairs and use them. Fix handrails that are broken or loose. Make sure that handrails are as long as the stairways. Check any carpeting to make sure that it is firmly attached to the stairs. Fix any carpet that is loose or worn. Avoid having throw rugs at the top or  bottom of the stairs. If you do have throw rugs, attach them to the floor with carpet tape. Make sure that you have a light switch at the top of the stairs and the bottom of the stairs. If you do not have them, ask someone to add them for you. What else can I do to help prevent falls? Wear shoes that: Do not have high heels. Have rubber bottoms. Are comfortable and fit you well. Are closed at the toe. Do not wear sandals. If you use a stepladder: Make sure that it is fully opened. Do not climb a closed stepladder. Make sure that both sides of the stepladder are locked into place. Ask someone to hold it for you, if possible. Clearly  mark and make sure that you can see: Any grab bars or handrails. First and last steps. Where the edge of each step is. Use tools that help you move around (mobility aids) if they are needed. These include: Canes. Walkers. Scooters. Crutches. Turn on the lights when you go into a dark area. Replace any light bulbs as soon as they burn out. Set up your furniture so you have a clear path. Avoid moving your furniture around. If any of your floors are uneven, fix them. If there are any pets around you, be aware of where they are. Review your medicines with your doctor. Some medicines can make you feel dizzy. This can increase your chance of falling. Ask your doctor what other things that you can do to help prevent falls. This information is not intended to replace advice given to you by your health care provider. Make sure you discuss any questions you have with your health care provider. Document Released: 12/15/2008 Document Revised: 07/27/2015 Document Reviewed: 03/25/2014 Elsevier Interactive Patient Education  2017 [employer].

## 2020-11-13 NOTE — Progress Notes (Signed)
Subjective:   Sharon Schmidt is a 70 y.o. female who presents for an Initial Medicare Annual Wellness Visit.  I connected with [place]ill[place] today by telephone and verified that I am speaking with the correct person using two identifiers. Location patient: home Location provider: work Persons participating in the virtual visit: patient, Marine scientist.    I discussed the limitations, risks, security and privacy concerns of performing an evaluation and management service by telephone and the availability of in person appointments. I also discussed with the patient that there may be a patient responsible charge related to this service. The patient expressed understanding and verbally consented to this telephonic visit.    Interactive audio and video telecommunications were attempted between this provider and patient, however failed, due to patient having technical difficulties OR patient did not have access to video capability.  We continued and completed visit with audio only.  Some vital signs may be absent or patient reported.   Time Spent with patient on telephone encounter: 20 minutes   Review of Systems     Cardiac Risk Factors include: advanced age (>51mn, >>55women);smoking/ tobacco exposure     Objective:    Today's Vitals   11/13/20 1021  Weight: 141 lb (64 kg)  Height: '5\' 3"'$  (1.6 m)   Body mass index is 24.98 kg/m.  Advanced Directives 11/13/2020 12/17/2016  Does Patient Have a Medical Advance Directive? Yes No  Type of AParamedicof [place]oyal [place] will -  Copy of [place] Chart? No - copy requested -    Current Medications (verified) Outpatient Encounter Medications as of 11/13/2020  Medication Sig   Cholecalciferol (VITAMIN D PO) Take by mouth. Take 1 gel cap by mouth daily.   clonazePAM (KLONOPIN) 1 MG tablet TAKE 1/2 TO 1 (ONE-HALF TO ONE) TABLET BY MOUTH TWICE DAILY   ibuprofen (ADVIL,MOTRIN) 200 MG tablet Take 200 mg by mouth  at bedtime as needed.   Magnesium 250 MG TABS Take by mouth.   Menthol, Topical Analgesic, (ICY HOT EX) Apply topically.   NON FORMULARY    OVER THE COUNTER MEDICATION Multivitamin Chewable-Take 1 chewable daily.   OVER THE COUNTER MEDICATION Antiacid-Take 1 tablet by mouth as needed.   vitamin E 400 UNIT capsule Take 800 Units by mouth daily.   Pitavastatin Calcium 1 MG TABS Take 1 tablet (1 mg total) by mouth daily. (Patient not taking: Reported on 11/13/2020)   No facility-administered encounter medications on file as of 11/13/2020.    Allergies (verified) Augmentin [amoxicillin-pot clavulanate], Fosamax [alendronate], and Statins   History: Past Medical History:  Diagnosis Date   10 year risk of MI or stroke 7.5% or greater 08/08/2016   Allergy    Anxiety    Aortic atherosclerosis (HCC)    Cataracts, bilateral    Coronary atherosclerosis 08/08/2016   Depression    Emphysema lung ([place]    Osteoporosis    Tobacco abuse 08/08/2016   Past Surgical History:  Procedure Laterality Date   BREAST BIOPSY Left 09/24/2017   benign   CESAREAN SECTION     KNEE SURGERY     x2   OVARIAN CYST REMOVAL     TONSILLECTOMY     Family History  Problem Relation Age of Onset   Diabetes Mother    Hypertension Father    Macular degeneration Father    Vision loss Daughter    Colon cancer Neg Hx    Esophageal cancer Neg Hx    Rectal  cancer Neg Hx    Stomach cancer Neg Hx    Social History   Socioeconomic History   Marital status: Married    Spouse name: Not on file   Number of children: Not on file   Years of education: Not on file   Highest education level: Not on file  Occupational History   Not on file  Tobacco Use   Smoking status: Every Day    Packs/day: 0.75    Years: 46.00    Pack years: 34.50    Types: Cigarettes    Start date: 07/10/1985   Smokeless tobacco: Never  Substance and Sexual Activity   Alcohol use: Yes    Comment: Social   Drug use: No   Sexual activity: Not  on file  Other Topics Concern   Not on file  Social History Narrative   Not on file   Social Determinants of Health   Financial Resource Strain: Low Risk    Difficulty of Paying Living Expenses: Not hard at all  Food Insecurity: No Food Insecurity   Worried About Charity fundraiser in the Last Year: Never true   [place] in the Last Year: Never true  Transportation Needs: No Transportation Needs   Lack of Transportation (Medical): No   Lack of Transportation (Non-Medical): No  Physical Activity: Sufficiently Active   Days of Exercise per Week: 7 days   Minutes of Exercise per Session: 30 min  Stress: No Stress Concern Present   Feeling of Stress : Not at all  Social Connections: Moderately Integrated   Frequency of Communication with Friends and Family: More than three times a week   Frequency of Social Gatherings with Friends and Family: More than three times a week   Attends Religious Services: More than 4 times per year   Active Member of [employer] or Organizations: No   Attends Music therapist: Never   Marital Status: Married    Tobacco Counseling Ready to quit: Not Answered Counseling given: Not Answered   Clinical Intake:  Pre-visit preparation completed: Yes  Pain : No/denies pain     BMI - recorded: 24.98 Nutritional Status: BMI of 19-24  Normal Nutritional Risks: None Diabetes: No  How often do you need to have someone help you when you read instructions, pamphlets, or other written materials from your doctor or pharmacy?: 1 - Never  Diabetic?No  Interpreter Needed?: No  Information entered by :: Caroleen Hamman LPN   Activities of Daily Living In your present state of health, do you have any difficulty performing the following activities: 11/13/2020 01/19/2020  Hearing? N N  Vision? N N  Difficulty concentrating or making decisions? N N  Walking or climbing stairs? N N  Dressing or bathing? N N  Doing errands, shopping? N N   Preparing Food and eating ? N -  Using the Toilet? N -  In the past six months, have you accidently leaked urine? N -  Do you have problems with loss of bowel control? N -  Managing your Medications? N -  Managing your Finances? N -  Housekeeping or managing your Housekeeping? N -  Some recent data might be hidden    Patient Care Team: Shelda Pal, DO as PCP - General (Family Medicine)  Indicate any recent Medical Services you may have received from other than [facility] providers in the past year (date may be approximate).     Assessment:   This is a routine  wellness examination for [place].  Hearing/Vision screen Hearing Screening - Comments:: No issues Vision Screening - Comments:: Wears glasses Last eye exam-Dr. Perez-08/2020  Dietary issues and exercise activities discussed: Current Exercise Habits: Home exercise routine, Type of exercise: walking (swimming), Time (Minutes): 30, Frequency (Times/Week): 6, Weekly Exercise (Minutes/Week): 180, Intensity: Mild   Goals Addressed             This Visit's Progress    Patient Stated       Eat healthier       Depression Screen PHQ 2/9 Scores 11/13/2020 08/07/2020 01/19/2020 07/10/2016  PHQ - 2 Score 0 0 0 0  PHQ- 9 Score - - - 0    Fall Risk Fall Risk  11/13/2020 08/07/2020 07/10/2016  Falls in the past year? 0 0 No  Number falls in past yr: 0 0 -  Injury with Fall? 0 0 -  Risk for fall due to : - No Fall Risks -  Follow up Falls prevention discussed Falls evaluation completed -    FALL RISK PREVENTION PERTAINING TO THE HOME:  Any stairs in or around the home? No  Home free of loose throw rugs in walkways, pet beds, electrical cords, etc? Yes  Adequate lighting in your home to reduce risk of falls? Yes   ASSISTIVE DEVICES UTILIZED TO PREVENT FALLS:  Life alert? No  Use of a cane, walker or w/c? No  Grab bars in the bathroom? Yes  Shower chair or bench in shower? No  Elevated toilet seat or a handicapped  toilet? No   TIMED UP AND GO:  Was the test performed? No . Phone visit   Cognitive Function: Normal cognitive status assessed by this Nurse Health Advisor. No abnormalities found.          Immunizations Immunization History  Administered Date(s) Administered   Influenza, High Dose Seasonal PF 12/20/2016, 12/25/2017, 12/03/2018   Moderna Sars-Covid-2 Vaccination 05/21/2019, 06/21/2019   Pneumococcal Conjugate-13 11/08/2016   Pneumococcal Polysaccharide-23 02/15/2019   Tdap 09/10/2017   Zoster Recombinat (Shingrix) 03/12/2017, 09/10/2017   Zoster, Live 07/10/2012    TDAP status: Up to date  Flu Vaccine status: Due, Education has been provided regarding the importance of this vaccine. Advised may receive this vaccine at local pharmacy or Health Dept. Aware to provide a copy of the vaccination record if obtained from local pharmacy or Health Dept. Verbalized acceptance and understanding.  Pneumococcal vaccine status: Up to date  Covid-19 vaccine status: Information provided on how to obtain vaccines.   Qualifies for Shingles Vaccine? No   Zostavax completed Yes   Shingrix Completed?: Yes  Screening Tests Health Maintenance  Topic Date Due   COVID-19 Vaccine (3 - Booster for Moderna series) 11/29/2020 (Originally 11/21/2019)   INFLUENZA VACCINE  06/01/2021 (Originally 10/02/2020)   MAMMOGRAM  07/12/2022   TETANUS/TDAP  09/11/2027   COLONOSCOPY (Pts 45-71yr Insurance coverage will need to be confirmed)  11/21/2027   DEXA SCAN  Completed   Hepatitis C Screening  Completed   PNA vac Low Risk Adult  Completed   Zoster Vaccines- Shingrix  Completed   HPV VACCINES  Aged Out    Health Maintenance  There are no preventive care reminders to display for this patient.   Colorectal cancer screening: Type of screening: Colonoscopy. Completed 11/20/2017. Repeat every 10 years  Mammogram status: Completed Bilateral 07/11/2020. Repeat every year  Bone Density status: Completed  08/15/2020. Results reflect: Bone density results: OSTEOPOROSIS. Repeat every 2 years.  Lung Cancer Screening: CT  chest-completed 03/27/2020  Additional Screening:  Hepatitis C Screening: does qualify; Discuss with PCP  Vision Screening: Recommended annual ophthalmology exams for early detection of glaucoma and other disorders of the eye. Is the patient up to date with their annual eye exam?  Yes  Who is the provider or what is the name of the office in which the patient attends annual eye exams? Dr. Sharen Counter   Dental Screening: Recommended annual dental exams for proper oral hygiene  Community Resource Referral / Chronic Care Management: CRR required this visit?  No   CCM required this visit?  No      Plan:     I have personally reviewed and noted the following in the patient's chart:   Medical and social history Use of alcohol, tobacco or illicit drugs  Current medications and supplements including opioid prescriptions. Patient is not currently taking opioid prescriptions. Functional ability and status Nutritional status Physical activity Advanced directives List of other physicians Hospitalizations, surgeries, and ER visits in previous 12 months Vitals Screenings to include cognitive, depression, and falls Referrals and appointments  In addition, I have reviewed and discussed with patient certain preventive protocols, quality metrics, and best practice recommendations. A written personalized care plan for preventive services as well as general preventive health recommendations were provided to patient.   Due to this being a telephonic visit, the after visit summary with patients personalized plan was offered to patient via mail or my-chart. Patient declined at this time.   Marta Antu, LPN   [phone]  Nurse Health Advisor  Nurse Notes: None

## 2020-12-27 ENCOUNTER — Other Ambulatory Visit: Payer: Self-pay | Admitting: Family Medicine

## 2020-12-27 DIAGNOSIS — F419 Anxiety disorder, unspecified: Secondary | ICD-10-CM

## 2020-12-27 NOTE — Telephone Encounter (Signed)
Last RF--03/27/20--#30 with 5 refills Last OV--08/07/20 No UDS/CSC

## 2021-02-06 ENCOUNTER — Telehealth: Payer: Self-pay

## 2021-02-06 ENCOUNTER — Ambulatory Visit: Payer: Medicare HMO | Admitting: Family Medicine

## 2021-02-06 NOTE — Telephone Encounter (Signed)
Called the patient and she was up most of last night throwing up/diarrhea, no fever. She prefers to cancel///not to reschedule as not feeling well enough to even pick a day to come in. She will call if she needs something due to being sick Did offer a video visit but she did decline for now, will call back if she decides to do one.

## 2021-02-06 NOTE — Telephone Encounter (Signed)
Caller is needing to reschedule 10 am appointment. She has had a stomach bug all night.  Telephone: [phone]

## 2021-02-21 ENCOUNTER — Encounter: Payer: Self-pay | Admitting: Family Medicine

## 2021-02-21 ENCOUNTER — Ambulatory Visit (INDEPENDENT_AMBULATORY_CARE_PROVIDER_SITE_OTHER): Payer: Medicare HMO | Admitting: Family Medicine

## 2021-02-21 VITALS — BP 141/81 | HR 74 | Temp 98.4°F | Ht 63.0 in | Wt 146.1 lb

## 2021-02-21 DIAGNOSIS — M545 Low back pain, unspecified: Secondary | ICD-10-CM

## 2021-02-21 DIAGNOSIS — R635 Abnormal weight gain: Secondary | ICD-10-CM | POA: Diagnosis not present

## 2021-02-21 DIAGNOSIS — R5383 Other fatigue: Secondary | ICD-10-CM

## 2021-02-21 DIAGNOSIS — Z789 Other specified health status: Secondary | ICD-10-CM | POA: Diagnosis not present

## 2021-02-21 DIAGNOSIS — I700 Atherosclerosis of aorta: Secondary | ICD-10-CM

## 2021-02-21 DIAGNOSIS — F419 Anxiety disorder, unspecified: Secondary | ICD-10-CM | POA: Diagnosis not present

## 2021-02-21 DIAGNOSIS — M810 Age-related osteoporosis without current pathological fracture: Secondary | ICD-10-CM | POA: Diagnosis not present

## 2021-02-21 DIAGNOSIS — Z79899 Other long term (current) drug therapy: Secondary | ICD-10-CM | POA: Diagnosis not present

## 2021-02-21 DIAGNOSIS — G8929 Other chronic pain: Secondary | ICD-10-CM

## 2021-02-21 DIAGNOSIS — R69 Illness, unspecified: Secondary | ICD-10-CM | POA: Diagnosis not present

## 2021-02-21 LAB — COMPREHENSIVE METABOLIC PANEL
ALT: 14 U/L (ref 0–35)
AST: 21 U/L (ref 0–37)
Albumin: 4.2 g/dL (ref 3.5–5.2)
Alkaline Phosphatase: 61 U/L (ref 39–117)
BUN: 7 mg/dL (ref 6–23)
CO2: 28 mEq/L (ref 19–32)
Calcium: 9.2 mg/dL (ref 8.4–10.5)
Chloride: 101 mEq/L (ref 96–112)
Creatinine, Ser: 0.78 mg/dL (ref 0.40–1.20)
GFR: 76.91 mL/min (ref >60.00)
Glucose, Bld: 93 mg/dL (ref 70–99)
Potassium: 4.2 mEq/L (ref 3.5–5.1)
Sodium: 136 mEq/L (ref 135–145)
Total Bilirubin: 0.7 mg/dL (ref 0.2–1.2)
Total Protein: 6.6 g/dL (ref 6.0–8.3)

## 2021-02-21 LAB — LIPID PANEL
Cholesterol: 153 mg/dL (ref 0–200)
HDL: 77.2 mg/dL (ref >39.00)
LDL Cholesterol: 62 mg/dL (ref 0–99)
NonHDL: 75.44
Total CHOL/HDL Ratio: 2
Triglycerides: 65 mg/dL (ref 0.0–149.0)
VLDL: 13 mg/dL (ref 0.0–40.0)

## 2021-02-21 LAB — CBC
HCT: 38.2 % (ref 36.0–46.0)
Hemoglobin: 13 g/dL (ref 12.0–15.0)
MCHC: 34.1 g/dL (ref 30.0–36.0)
MCV: 92.3 fl (ref 78.0–100.0)
Platelets: 293 10*3/uL (ref 150.0–400.0)
RBC: 4.14 Mil/uL (ref 3.87–5.11)
RDW: 13.6 % (ref 11.5–15.5)
WBC: 8.7 10*3/uL (ref 4.0–10.5)

## 2021-02-21 LAB — VITAMIN D 25 HYDROXY (VIT D DEFICIENCY, FRACTURES): VITD: 39.27 ng/mL (ref 30.00–100.00)

## 2021-02-21 LAB — TSH: TSH: 1.26 u[IU]/mL (ref 0.35–5.50)

## 2021-02-21 MED ORDER — PITAVASTATIN CALCIUM 1 MG PO TABS: 1.0000 mg | ORAL_TABLET | Freq: Every day | ORAL | 3 refills | Status: DC

## 2021-02-21 NOTE — Progress Notes (Signed)
Chief Complaint  Patient presents with   Follow-up    medication   Pain    Edema     Subjective Sharon Schmidt presents for f/u anxiety.  Pt is currently being treated with Klonopin 0.5-1 mg bid prn Reports doing fine since treatment. No thoughts of harming self or others. No self-medication with alcohol, prescription drugs or illicit drugs. Pt is not following with a counselor/psychologist.  Hyperlipidemia Patient presents for dyslipidemia follow up. Currently on Livalo 1 mg/d.  She denies myalgias. She is usually adhering to a healthy diet. Exercise: some walking, gardening The patient is known to have coexisting coronary artery disease. No CP or SOB.  Has been having weight gain, fatigue, diffuse jt pain.  She did well with acupuncture in the past after failing physical therapy in addition to injections.  No recent injury or change in activity.  No neuro signs or symptoms.  Past Medical History:  Diagnosis Date   10 year risk of MI or stroke 7.5% or greater 08/08/2016   Allergy    Anxiety    Aortic atherosclerosis (HCC)    Cataracts, bilateral    Coronary atherosclerosis 08/08/2016   Depression    Emphysema lung ([place])    Osteoporosis    Tobacco abuse 08/08/2016   Allergies as of 02/21/2021       Reactions   Augmentin [amoxicillin-pot Clavulanate] Nausea Only   Fosamax [alendronate]    N/V/stomach upset   Statins    Myalgias        Medication List        Accurate as of February 21, 2021  9:40 AM. If you have any questions, ask your nurse or doctor.          clonazePAM 1 MG tablet Commonly known as: KLONOPIN TAKE 1/2 TO 1 TABLET BY MOUTH TWICE DAILY   ibuprofen 200 MG tablet Commonly known as: ADVIL Take 200 mg by mouth at bedtime as needed.   ICY HOT EX Apply topically.   Magnesium 250 MG Tabs Take by mouth.   NON FORMULARY   OVER THE COUNTER MEDICATION Multivitamin Chewable-Take 1 chewable daily.   OVER THE COUNTER  MEDICATION Antiacid-Take 1 tablet by mouth as needed.   Pitavastatin Calcium 1 MG Tabs Take 1 tablet (1 mg total) by mouth daily.   VITAMIN D PO Take by mouth. Take 1 gel cap by mouth daily.   vitamin E 180 MG (400 UNITS) capsule Take 800 Units by mouth daily.        Exam BP (!) 141/81    Pulse 74    Temp 98.4 F (36.9 C) (Oral)    Ht _0  (1.6 m)    Wt 146 lb 2 oz (66.3 kg)    SpO2 97%    BMI 25.88 kg/m  General:  well developed, well nourished, in no apparent distress Heart: RRR, no bruits, no LE edema Lungs:  CTAB. No respiratory distress MSK: TTP over the lumbar paraspinal musculature bilaterally Neuro: DTRs equal and symmetric throughout, no clonus, no cerebellar signs, gait is normal. Psych: well oriented with normal range of affect and age-appropriate judgement/insight, alert and oriented x4.  Assessment and Plan  Anxiety  Aortic atherosclerosis ([place]) - Plan: Comprehensive metabolic panel, Lipid panel  Encounter for long-term (current) use of high-risk medication - Plan: Drug Monitoring Panel [id number] , Urine  Other fatigue - Plan: CBC, TSH  Weight gain  Chronic bilateral low back pain without sciatica  Age-related osteoporosis without current pathological fracture -  Plan: VITAMIN D 25 Hydroxy (Vit-D Deficiency, Fractures)  Statin intolerance  Chronic, stable. Cont Klonopin prn.  Chronic, stable. Cont Livalo 1 mg/d. Counseled on diet/exercises. Ck labs. Ck labs. Will consider acupuncture. Low inflamm diet provided. Tylenol. Tai chi/yoga rec'd.  Ck Vit D.  Tolerates Livalo for now.  F/u in 6 mo. The patient voiced understanding and agreement to the plan.  [place], DO 02/21/21 9:40 AM

## 2021-02-21 NOTE — Patient Instructions (Addendum)
Keep the diet clean and stay active.  OK to take Tylenol 1000 mg (2 extra strength tabs) or 975 mg (3 regular strength tabs) every 6 hours as needed.  Aim to do some physical exertion for 150 minutes per week. This is typically divided into 5 days per week, 30 minutes per day. The activity should be enough to get your heart rate up. Anything is better than nothing if you have time constraints. Consider yoga and tai chi.   I think acupuncture may serve you well again.   Foods that may reduce pain: 1) Ginger 2) Blueberries 3) Salmon 4) Pumpkin seeds 5) dark chocolate 6) turmeric 7) tart cherries 8) virgin olive oil 9) chilli peppers 10) mint  Let us know if you need anything.

## 2021-03-01 LAB — DRUG MONITORING PANEL 376104, URINE
Alphahydroxyalprazolam: NEGATIVE ng/mL (ref <25)
Alphahydroxymidazolam: NEGATIVE ng/mL (ref <50)
Alphahydroxytriazolam: NEGATIVE ng/mL (ref <50)
Aminoclonazepam: 281 ng/mL — ABNORMAL HIGH (ref <25)
Amphetamines: NEGATIVE ng/mL (ref <500)
Barbiturates: NEGATIVE ng/mL (ref <300)
Benzodiazepines: POSITIVE ng/mL — AB (ref <100)
Cocaine Metabolite: NEGATIVE ng/mL (ref <150)
Desmethyltramadol: NEGATIVE ng/mL (ref <100)
Hydroxyethylflurazepam: NEGATIVE ng/mL (ref <50)
Lorazepam: NEGATIVE ng/mL (ref <50)
Nordiazepam: NEGATIVE ng/mL (ref <50)
Opiates: NEGATIVE ng/mL (ref <100)
Oxazepam: NEGATIVE ng/mL (ref <50)
Oxycodone: NEGATIVE ng/mL (ref <100)
Temazepam: NEGATIVE ng/mL (ref <50)
Tramadol: NEGATIVE ng/mL (ref <100)

## 2021-03-01 LAB — DM TEMPLATE

## 2021-03-14 DIAGNOSIS — H52223 Regular astigmatism, bilateral: Secondary | ICD-10-CM | POA: Diagnosis not present

## 2021-03-23 ENCOUNTER — Other Ambulatory Visit: Payer: Self-pay | Admitting: Family Medicine

## 2021-03-23 DIAGNOSIS — F17210 Nicotine dependence, cigarettes, uncomplicated: Secondary | ICD-10-CM

## 2021-04-12 ENCOUNTER — Other Ambulatory Visit: Payer: Self-pay | Admitting: Family Medicine

## 2021-04-12 DIAGNOSIS — F419 Anxiety disorder, unspecified: Secondary | ICD-10-CM

## 2021-04-12 NOTE — Telephone Encounter (Signed)
Requesting: clonazepam 1mg   Contract: None UDS: 02/21/2021  Last Visit: 02/21/2021  Next Visit: 08/22/2021 Last Refill: 12/27/2020 #30 and 2RF  Please Advise

## 2021-04-20 ENCOUNTER — Other Ambulatory Visit (HOSPITAL_BASED_OUTPATIENT_CLINIC_OR_DEPARTMENT_OTHER): Payer: Self-pay | Admitting: Family Medicine

## 2021-04-20 DIAGNOSIS — Z1231 Encounter for screening mammogram for malignant neoplasm of breast: Secondary | ICD-10-CM

## 2021-06-18 ENCOUNTER — Ambulatory Visit (INDEPENDENT_AMBULATORY_CARE_PROVIDER_SITE_OTHER): Payer: Medicare HMO | Admitting: Family Medicine

## 2021-06-18 ENCOUNTER — Ambulatory Visit (HOSPITAL_BASED_OUTPATIENT_CLINIC_OR_DEPARTMENT_OTHER)
Admission: RE | Admit: 2021-06-18 | Discharge: 2021-06-18 | Disposition: A | Discharge status: Home / Self Care | Payer: Medicare HMO | Source: Ambulatory Visit | Attending: Family Medicine | Admitting: Family Medicine

## 2021-06-18 ENCOUNTER — Encounter: Payer: Self-pay | Admitting: Family Medicine

## 2021-06-18 VITALS — BP 132/80 | HR 71 | Temp 97.5°F | Ht 63.0 in | Wt 142.4 lb

## 2021-06-18 DIAGNOSIS — M79662 Pain in left lower leg: Secondary | ICD-10-CM | POA: Diagnosis not present

## 2021-06-18 DIAGNOSIS — M79605 Pain in left leg: Secondary | ICD-10-CM

## 2021-06-18 MED ORDER — HYDROCHLOROTHIAZIDE 25 MG PO TABS: 25.0000 mg | ORAL_TABLET | Freq: Every day | ORAL | 3 refills | Status: DC

## 2021-06-18 MED ORDER — DICLOFENAC SODIUM 1 % EX GEL: 2.0000 g | Freq: Four times a day (QID) | CUTANEOUS | 0 refills | Status: AC

## 2021-06-18 NOTE — Progress Notes (Signed)
Musculoskeletal Exam ? ?Patient: Sharon Schmidt DOB: [birth year] ? ?DOS: 06/18/2021 ? ?SUBJECTIVE: ? ?Chief Complaint:  ? ?Chief Complaint  ?Patient presents with  ? Leg Pain  ?  Left leg pain and swelling ?  ? ? ?Sharon Schmidt is a 71 y.o.  female for evaluation and treatment of left leg pain.  ? ?Onset:  1 week ago. No inj or change in activity.  ?Location: Left calf radiating to her knee and also ankle ?Character:  aching  ?Progression of issue:  is unchanged ?Associated symptoms: Swelling ?Denies redness, bruising, decreased range of motion ?Treatment: to date has been OTC NSAIDS and acetaminophen.   ?Neurovascular symptoms: no ? ?Past Medical History:  ?Diagnosis Date  ? 10 year risk of MI or stroke 7.5% or greater 08/08/2016  ? Allergy   ? Anxiety   ? Aortic atherosclerosis ([place])   ? Cataracts, bilateral   ? Coronary atherosclerosis 08/08/2016  ? Depression   ? Emphysema lung ([place])   ? Osteoporosis   ? Tobacco abuse 08/08/2016  ? ? ?Objective: ?VITAL SIGNS: BP 132/80   Pulse 71   Temp (!) 97.5 ?F (36.4 ?C) (Oral)   Ht '5\' 3"'$  (1.6 m)   Wt 142 lb 6 oz (64.6 kg)   SpO2 98%   BMI 25.22 kg/m?  ?Constitutional: Well formed, well developed. No acute distress. ?Thorax & Lungs: No accessory muscle use ?Musculoskeletal: Left calf.   ?Tenderness to palpation: Yes over gastrocnemius medially ?Deformity: no ?Ecchymosis: no ?Tests positive:  ?Tests negative: None [place] ?Neurologic: Normal sensory function. No focal deficits noted.  Gait antalgic. ?Psychiatric: Normal mood. Age appropriate judgment and insight. Alert & oriented x 3.   ? ?Assessment: ? ?Left leg pain - Plan: US Venous Img Lower Unilateral Left ? ?Plan: ?New problem with uncertain prognosis.  Check lower extremity duplex ultrasound to rule out DVT.  She is a smoker with unilateral swelling and pain.  Stretches/exercises for calf, heat, ice, Tylenol.  Okay to continue Voltaren gel which seems to be helpful. ?F/u pending above. ?The patient voiced understanding and  agreement to the plan. ? ? ?[place], DO ?06/18/21  ?4:51 PM ? ?

## 2021-06-18 NOTE — Patient Instructions (Signed)
We will be in touch regarding your imaging. ? ?Heat (pad or rice pillow in microwave) over affected area, 10-15 minutes twice daily.  ? ?Ice/cold pack over area for 10-15 min twice daily. ? ?OK to take Tylenol 1000 mg (2 extra strength tabs) or 975 mg (3 regular strength tabs) every 6 hours as needed. ? ?Let us know if you need anything. ? ?Stretching and range of motion exercises ?These exercises warm up your muscles and joints and improve the movement and flexibility of your lower leg. These exercises also help to relieve pain and stiffness. ? ?Exercise A: Gastrocnemius stretch ?Sit with your left / right leg extended. ?Loop a belt or towel around the ball of your left / right foot. The ball of your foot is on the walking surface, right under your toes. ?Hold both ends of the belt or towel. ?Keep your left / right ankle and foot relaxed and keep your knee straight while you use the belt or towel to pull your foot and ankle toward you. Stop at the first point of resistance. ?Hold this position for 30 seconds. ?Repeat 2 times. Complete this exercise 3 times per week. ? ?Exercise B: Ankle alphabet ?Sit with your left / right leg supported at the lower leg. ?Do not rest your foot on anything. ?Make sure your foot has room to move freely. ?Think of your left / right foot as a paintbrush, and move your foot to trace each letter of the alphabet in the air. Keep your hip and knee still while you trace. ?Trace every letter from A to Z. ?Repeat 2 times. Complete this exercise 3 times per week. ? ?Strengthening exercises ?These exercises build strength and endurance in your lower leg. Endurance is the ability to use your muscles for a long time, even after they get tired. ? ?Exercise C: Plantar flexors with band ?Sit with your left / right leg extended. ?Loop a rubber exercise band or tube around the ball of your left / right foot. The ball of your foot is on the walking surface, right under your toes. ?While holding both  ends of the band or tube, slowly point your toes downward, pushing them away from you. ?Hold this position for 3 seconds. ?Slowly return your foot to the starting position and repeat for a total of 10 repetitions. ?Repeat 2 times. Complete this exercise 3 times per week. ? ?Exercise D: Plantar flexors, standing ?Stand with your feet shoulder-width apart. ?Place your hands on a wall or table to steady yourself as needed, but try not to use it very much for support. ?Rise up on your toes. ?If this exercise is too easy, try these options: ?Shift your weight toward your left / right leg until you feel challenged. ?If told by your health care provider, stand on your left / right foot only. ?Hold this position for 3 seconds. ?Repeat for a total of 10 repetitions. ?Repeat 2 times. Complete this exercise 3 times per week. ? ?Exercise E: Plantar flexors, eccentric ?Stand on the balls of your feet on the edge of a step. The ball of your foot is on the walking surface, right under your toes. ?Place your hands on a wall or railing for balance as needed, but try not to lean on it for support. ?Rise up on your toes, using both legs to help. ?Slowly shift all of your weight to your left / right foot and lift your other foot off the step. ?Slowly lower your left / right  heel so it drops below the level of the step. You will feel a slight stretch in your left / right calf. ?Put your other foot back onto the step. ?Repeat 2 times. Complete this exercise 3 times per week. ?This information is not intended to replace advice given to you by your health care provider. Make sure you discuss any questions you have with your health care provider. ? ?

## 2021-07-16 ENCOUNTER — Ambulatory Visit (HOSPITAL_BASED_OUTPATIENT_CLINIC_OR_DEPARTMENT_OTHER)
Admission: RE | Admit: 2021-07-16 | Discharge: 2021-07-16 | Disposition: A | Discharge status: Home / Self Care | Payer: Medicare HMO | Source: Ambulatory Visit | Attending: Family Medicine | Admitting: Family Medicine

## 2021-07-16 ENCOUNTER — Encounter (HOSPITAL_BASED_OUTPATIENT_CLINIC_OR_DEPARTMENT_OTHER): Payer: Self-pay

## 2021-07-16 DIAGNOSIS — Z1231 Encounter for screening mammogram for malignant neoplasm of breast: Secondary | ICD-10-CM | POA: Insufficient documentation

## 2021-07-16 DIAGNOSIS — R69 Illness, unspecified: Secondary | ICD-10-CM | POA: Diagnosis not present

## 2021-07-16 DIAGNOSIS — F17210 Nicotine dependence, cigarettes, uncomplicated: Secondary | ICD-10-CM | POA: Insufficient documentation

## 2021-08-22 ENCOUNTER — Encounter: Payer: Self-pay | Admitting: Family Medicine

## 2021-08-22 ENCOUNTER — Ambulatory Visit (INDEPENDENT_AMBULATORY_CARE_PROVIDER_SITE_OTHER): Payer: Medicare HMO | Admitting: Family Medicine

## 2021-08-22 VITALS — BP 120/82 | HR 51 | Temp 97.9°F | Ht 63.0 in | Wt 143.2 lb

## 2021-08-22 DIAGNOSIS — I700 Atherosclerosis of aorta: Secondary | ICD-10-CM

## 2021-08-22 DIAGNOSIS — Z0000 Encounter for general adult medical examination without abnormal findings: Secondary | ICD-10-CM

## 2021-08-22 DIAGNOSIS — M810 Age-related osteoporosis without current pathological fracture: Secondary | ICD-10-CM

## 2021-08-22 DIAGNOSIS — J439 Emphysema, unspecified: Secondary | ICD-10-CM

## 2021-08-22 LAB — LIPID PANEL
Cholesterol: 207 mg/dL — ABNORMAL HIGH (ref 0–200)
HDL: 69.9 mg/dL (ref >39.00)
LDL Cholesterol: 112 mg/dL — ABNORMAL HIGH (ref 0–99)
NonHDL: 136.71
Total CHOL/HDL Ratio: 3
Triglycerides: 123 mg/dL (ref 0.0–149.0)
VLDL: 24.6 mg/dL (ref 0.0–40.0)

## 2021-08-22 LAB — COMPREHENSIVE METABOLIC PANEL
ALT: 15 U/L (ref 0–35)
AST: 18 U/L (ref 0–37)
Albumin: 4.1 g/dL (ref 3.5–5.2)
Alkaline Phosphatase: 53 U/L (ref 39–117)
BUN: 11 mg/dL (ref 6–23)
CO2: 28 mEq/L (ref 19–32)
Calcium: 9.4 mg/dL (ref 8.4–10.5)
Chloride: 100 mEq/L (ref 96–112)
Creatinine, Ser: 0.87 mg/dL (ref 0.40–1.20)
GFR: 67.23 mL/min (ref >60.00)
Glucose, Bld: 98 mg/dL (ref 70–99)
Potassium: 4.3 mEq/L (ref 3.5–5.1)
Sodium: 134 mEq/L — ABNORMAL LOW (ref 135–145)
Total Bilirubin: 0.6 mg/dL (ref 0.2–1.2)
Total Protein: 6.6 g/dL (ref 6.0–8.3)

## 2021-08-22 LAB — VITAMIN D 25 HYDROXY (VIT D DEFICIENCY, FRACTURES): VITD: 48.9 ng/mL (ref 30.00–100.00)

## 2021-08-22 NOTE — Progress Notes (Signed)
Chief Complaint  Patient presents with   Annual Exam     Well Woman Sharon Schmidt is here for a complete physical.   Her last physical was >1 year ago.  Current diet: in general, diet could be better. Current exercise: walking, gardening. Weight is stable and she denies daytime fatigue. Seatbelt? Yes Advanced directive? Yes  Health Maintenance Colonoscopy- Yes Shingrix- Yes Lung cancer screening- Yes DEXA- Yes Mammogram- Yes Tetanus- Yes Pneumonia- Yes Hep C screen- Yes  Past Medical History:  Diagnosis Date   10 year risk of MI or stroke 7.5% or greater 08/08/2016   Allergy    Anxiety    Aortic atherosclerosis (HCC)    Cataracts, bilateral    Coronary atherosclerosis 08/08/2016   Depression    Emphysema lung ([place])    Osteoporosis    Tobacco abuse 08/08/2016     Past Surgical History:  Procedure Laterality Date   BREAST BIOPSY Left 09/24/2017   benign   CESAREAN SECTION     KNEE SURGERY     x2   OVARIAN CYST REMOVAL     TONSILLECTOMY      Medications  Current Outpatient Medications on File Prior to Visit  Medication Sig Dispense Refill   Cholecalciferol (VITAMIN D PO) Take by mouth. Take 1 gel cap by mouth daily.     clonazePAM (KLONOPIN) 1 MG tablet TAKE 1/2 TO 1 (ONE-HALF TO ONE) TABLET BY MOUTH TWICE DAILY 30 tablet 5   diclofenac Sodium (VOLTAREN) 1 % GEL Apply 2 g topically 4 (four) times daily. 100 g 0   hydrochlorothiazide (HYDRODIURIL) 25 MG tablet Take 1 tablet (25 mg total) by mouth daily. 30 tablet 3   ibuprofen (ADVIL,MOTRIN) 200 MG tablet Take 200 mg by mouth at bedtime as needed.     Magnesium 250 MG TABS Take by mouth.     Menthol, Topical Analgesic, (ICY HOT EX) Apply topically.     NON FORMULARY      OVER THE COUNTER MEDICATION Multivitamin Chewable-Take 1 chewable daily.     OVER THE COUNTER MEDICATION Antiacid-Take 1 tablet by mouth as needed.     Pitavastatin Calcium 1 MG TABS Take 1 tablet (1 mg total) by mouth daily. 90 tablet 3   vitamin  E 400 UNIT capsule Take 800 Units by mouth daily.     Allergies Allergies  Allergen Reactions   Augmentin [Amoxicillin-Pot Clavulanate] Nausea Only   Fosamax [Alendronate]     N/V/stomach upset   Statins     Myalgias    Review of Systems: Constitutional:  no fevers Eye:  no recent significant change in vision Ears:  No changes in hearing Nose/Mouth/Throat:  no complaints of nasal congestion, no sore throat Cardiovascular: no chest pain Respiratory:  No shortness of breath Gastrointestinal:  No change in bowel habits GU:  Female: negative for dysuria Integumentary:  no new abnormal skin lesions reported (sees derm) Neurologic:  no headaches Endocrine:  denies unexplained weight changes  Exam BP 120/82   Pulse (!) 51   Temp 97.9 F (36.6 C) (Oral)   Ht '5\' 3"'$  (1.6 m)   Wt 143 lb 4 oz (65 kg)   SpO2 99%   BMI 25.38 kg/m  General:  well developed, well nourished, in no apparent distress Skin:  no significant moles, warts, or growths Head:  no masses, lesions, or tenderness Eyes:  pupils equal and round, sclera anicteric without injection Ears:  canals without lesions, TMs shiny without retraction, no obvious effusion, no erythema  Nose:  nares patent, septum midline, mucosa normal, and no drainage or sinus tenderness Throat/Pharynx:  lips and gingiva without lesion; tongue and uvula midline; non-inflamed pharynx; no exudates or postnasal drainage Neck: neck supple without adenopathy, thyromegaly, or masses Lungs:  clear to auscultation, breath sounds equal bilaterally, no respiratory distress Cardio:  regular rate and rhythm, no bruits or LE edema Abdomen:  abdomen soft, nontender; bowel sounds normal; no masses or organomegaly Genital: Deferred Neuro:  gait normal; deep tendon reflexes normal and symmetric Psych: well oriented with normal range of affect and appropriate judgment/insight  Assessment and Plan  Well adult exam  Aortic atherosclerosis ([place]) - Plan: Lipid  panel, Comprehensive metabolic panel  Age-related osteoporosis without current pathological fracture - Plan: VITAMIN D 25 Hydroxy (Vit-D Deficiency, Fractures)   Well 71 y.o. female. Counseled on diet and exercise. Advanced directive form requested today.  Counseled on tobacco cessation. Other orders as above. Follow up 6 mo. The patient voiced understanding and agreement to the plan.  [place], DO 08/22/21 10:12 AM

## 2021-08-22 NOTE — Patient Instructions (Signed)
Give us 2-3 business days to get the results of your labs back.   Keep the diet clean and stay active.  Please get me a copy of your advanced directive form at your convenience.   Let us know if you need anything.  

## 2021-09-20 DIAGNOSIS — H18523 Epithelial (juvenile) corneal dystrophy, bilateral: Secondary | ICD-10-CM | POA: Diagnosis not present

## 2021-09-20 DIAGNOSIS — H25013 Cortical age-related cataract, bilateral: Secondary | ICD-10-CM | POA: Diagnosis not present

## 2021-09-20 DIAGNOSIS — H2513 Age-related nuclear cataract, bilateral: Secondary | ICD-10-CM | POA: Diagnosis not present

## 2021-10-17 ENCOUNTER — Other Ambulatory Visit: Payer: Self-pay | Admitting: Family Medicine

## 2021-10-17 DIAGNOSIS — F419 Anxiety disorder, unspecified: Secondary | ICD-10-CM

## 2021-10-17 NOTE — Telephone Encounter (Signed)
Last OV--08/22/2021 Last RF--#30 with 5 RF's on 04/12/2021 [facility] -- 02/21/2021

## 2021-11-15 ENCOUNTER — Ambulatory Visit (INDEPENDENT_AMBULATORY_CARE_PROVIDER_SITE_OTHER): Payer: Medicare HMO | Admitting: *Deleted

## 2021-11-15 DIAGNOSIS — Z0000 Encounter for general adult medical examination without abnormal findings: Secondary | ICD-10-CM | POA: Diagnosis not present

## 2021-11-15 NOTE — Patient Instructions (Signed)
[name] , Thank you for taking time to come for your Medicare Wellness Visit. I appreciate your ongoing commitment to your health goals. Please review the following plan we discussed and let me know if I can assist you in the future.   These are the goals we discussed:  Goals      Patient Stated     Eat healthier        This is a list of the screening recommended for you and due dates:  Health Maintenance  Topic Date Due   Flu Shot  10/02/2021   COVID-19 Vaccine (3 - Moderna risk series) 02/21/2022*   Mammogram  07/17/2023   Tetanus Vaccine  09/11/2027   Colon Cancer Screening  11/21/2027   Pneumonia Vaccine  Completed   DEXA scan (bone density measurement)  Completed   Hepatitis C Screening: USPSTF Recommendation to screen - Ages 71-79 yo.  Completed   Zoster (Shingles) Vaccine  Completed   HPV Vaccine  Aged Out  *Topic was postponed. The date shown is not the original due date.      Next appointment: Follow up in one year for your annual wellness visit 11/19/22 11:00am   Preventive Care 65 Years and Older, Female Preventive care refers to lifestyle choices and visits with your health care provider that can promote health and wellness. What does preventive care include? A yearly physical exam. This is also called an annual well check. Dental exams once or twice a year. Routine eye exams. Ask your health care provider how often you should have your eyes checked. Personal lifestyle choices, including: Daily care of your teeth and gums. Regular physical activity. Eating a healthy diet. Avoiding tobacco and drug use. Limiting alcohol use. Practicing safe sex. Taking low-dose aspirin every day. Taking vitamin and mineral supplements as recommended by your health care provider. What happens during an annual well check? The services and screenings done by your health care provider during your annual well check will depend on your age, overall health, lifestyle risk  factors, and family history of disease. Counseling  Your health care provider may ask you questions about your: Alcohol use. Tobacco use. Drug use. Emotional well-being. Home and relationship well-being. Sexual activity. Eating habits. History of falls. Memory and ability to understand (cognition). Work and work Statistician. Reproductive health. Screening  You may have the following tests or measurements: Height, weight, and BMI. Blood pressure. Lipid and cholesterol levels. These may be checked every 5 years, or more frequently if you are over 66 years old. Skin check. Lung cancer screening. You may have this screening every year starting at age 14 if you have a 30-pack-year history of smoking and currently smoke or have quit within the past 15 years. Fecal occult blood test (FOBT) of the stool. You may have this test every year starting at age 59. Flexible sigmoidoscopy or colonoscopy. You may have a sigmoidoscopy every 5 years or a colonoscopy every 10 years starting at age 15. Hepatitis C blood test. Hepatitis B blood test. Sexually transmitted disease (STD) testing. Diabetes screening. This is done by checking your blood sugar (glucose) after you have not eaten for a while (fasting). You may have this done every 1-3 years. Bone density scan. This is done to screen for osteoporosis. You may have this done starting at age 68. Mammogram. This may be done every 1-2 years. Talk to your health care provider about how often you should have regular mammograms. Talk with your health care provider about  your test results, treatment options, and if necessary, the need for more tests. Vaccines  Your health care provider may recommend certain vaccines, such as: Influenza vaccine. This is recommended every year. Tetanus, diphtheria, and acellular pertussis (Tdap, Td) vaccine. You may need a Td booster every 10 years. Zoster vaccine. You may need this after age 43. Pneumococcal 13-valent  conjugate (PCV13) vaccine. One dose is recommended after age 53. Pneumococcal polysaccharide (PPSV23) vaccine. One dose is recommended after age 31. Talk to your health care provider about which screenings and vaccines you need and how often you need them. This information is not intended to replace advice given to you by your health care provider. Make sure you discuss any questions you have with your health care provider. Document Released: 03/17/2015 Document Revised: 11/08/2015 Document Reviewed: 12/20/2014 Elsevier Interactive Patient Education  2017 [employer] Prevention in the Home Falls can cause injuries. They can happen to people of all ages. There are many things you can do to make your home safe and to help prevent falls. What can I do on the outside of my home? Regularly fix the edges of walkways and driveways and fix any cracks. Remove anything that might make you trip as you walk through a door, such as a raised step or threshold. Trim any bushes or trees on the path to your home. Use bright outdoor lighting. Clear any walking paths of anything that might make someone trip, such as rocks or tools. Regularly check to see if handrails are loose or broken. Make sure that both sides of any steps have handrails. Any raised decks and porches should have guardrails on the edges. Have any leaves, snow, or ice cleared regularly. Use sand or salt on walking paths during winter. Clean up any spills in your garage right away. This includes oil or grease spills. What can I do in the bathroom? Use night lights. Install grab bars by the toilet and in the tub and shower. Do not use towel bars as grab bars. Use non-skid mats or decals in the tub or shower. If you need to sit down in the shower, use a plastic, non-slip stool. Keep the floor dry. Clean up any water that spills on the floor as soon as it happens. Remove soap buildup in the tub or shower regularly. Attach bath mats  securely with double-sided non-slip rug tape. Do not have throw rugs and other things on the floor that can make you trip. What can I do in the bedroom? Use night lights. Make sure that you have a light by your bed that is easy to reach. Do not use any sheets or blankets that are too big for your bed. They should not hang down onto the floor. Have a firm chair that has side arms. You can use this for support while you get dressed. Do not have throw rugs and other things on the floor that can make you trip. What can I do in the kitchen? Clean up any spills right away. Avoid walking on wet floors. Keep items that you use a lot in easy-to-reach places. If you need to reach something above you, use a strong step stool that has a grab bar. Keep electrical cords out of the way. Do not use floor polish or wax that makes floors slippery. If you must use wax, use non-skid floor wax. Do not have throw rugs and other things on the floor that can make you trip. What can I do with  my stairs? Do not leave any items on the stairs. Make sure that there are handrails on both sides of the stairs and use them. Fix handrails that are broken or loose. Make sure that handrails are as long as the stairways. Check any carpeting to make sure that it is firmly attached to the stairs. Fix any carpet that is loose or worn. Avoid having throw rugs at the top or bottom of the stairs. If you do have throw rugs, attach them to the floor with carpet tape. Make sure that you have a light switch at the top of the stairs and the bottom of the stairs. If you do not have them, ask someone to add them for you. What else can I do to help prevent falls? Wear shoes that: Do not have high heels. Have rubber bottoms. Are comfortable and fit you well. Are closed at the toe. Do not wear sandals. If you use a stepladder: Make sure that it is fully opened. Do not climb a closed stepladder. Make sure that both sides of the stepladder  are locked into place. Ask someone to hold it for you, if possible. Clearly mark and make sure that you can see: Any grab bars or handrails. First and last steps. Where the edge of each step is. Use tools that help you move around (mobility aids) if they are needed. These include: Canes. Walkers. Scooters. Crutches. Turn on the lights when you go into a dark area. Replace any light bulbs as soon as they burn out. Set up your furniture so you have a clear path. Avoid moving your furniture around. If any of your floors are uneven, fix them. If there are any pets around you, be aware of where they are. Review your medicines with your doctor. Some medicines can make you feel dizzy. This can increase your chance of falling. Ask your doctor what other things that you can do to help prevent falls. This information is not intended to replace advice given to you by your health care provider. Make sure you discuss any questions you have with your health care provider. Document Released: 12/15/2008 Document Revised: 07/27/2015 Document Reviewed: 03/25/2014 Elsevier Interactive Patient Education  2017 [employer].

## 2021-11-15 NOTE — Progress Notes (Signed)
Subjective:   Sharon Schmidt is a 71 y.o. female who presents for Medicare Annual (Subsequent) preventive examination.  I connected with  Deno Etienne on 11/15/21 by a audio enabled telemedicine application and verified that I am speaking with the correct person using two identifiers.  Patient Location: [place]  Provider Location: Office/Clinic  I discussed the limitations of evaluation and management by telemedicine. The patient expressed understanding and agreed to proceed.  Review of Systems    Defer to PCP Cardiac Risk Factors include: advanced age (>77mn, >>8women);smoking/ tobacco exposure     Objective:    There were no vitals filed for this visit. There is no height or weight on file to calculate BMI.     11/15/2021   11:04 AM 11/13/2020   10:27 AM 12/17/2016    9:32 AM  Advanced Directives  Does Patient Have a Medical Advance Directive? Yes Yes No  Type of Advance Directive Living will HCrozetLiving will   Copy of HY-[place] Chart?  No - copy requested     Current Medications (verified) Outpatient Encounter Medications as of 11/15/2021  Medication Sig   Cholecalciferol (VITAMIN D PO) Take by mouth. Take 1 gel cap by mouth daily.   clonazePAM (KLONOPIN) 1 MG tablet TAKE 1/2 TO 1 (ONE-HALF TO ONE) TABLET BY MOUTH TWICE DAILY   diclofenac Sodium (VOLTAREN) 1 % GEL Apply 2 g topically 4 (four) times daily.   hydrochlorothiazide (HYDRODIURIL) 25 MG tablet Take 1 tablet (25 mg total) by mouth daily.   ibuprofen (ADVIL,MOTRIN) 200 MG tablet Take 200 mg by mouth at bedtime as needed.   Magnesium 250 MG TABS Take by mouth.   Menthol, Topical Analgesic, (ICY HOT EX) Apply topically.   NON FORMULARY    OVER THE COUNTER MEDICATION Multivitamin Chewable-Take 1 chewable daily.   OVER THE COUNTER MEDICATION Antiacid-Take 1 tablet by mouth as needed.   Pitavastatin Calcium 1 MG TABS Take 1 tablet (1 mg total) by mouth  daily.   vitamin E 400 UNIT capsule Take 800 Units by mouth daily.   No facility-administered encounter medications on file as of 11/15/2021.    Allergies (verified) Augmentin [amoxicillin-pot clavulanate], Fosamax [alendronate], and Statins   History: Past Medical History:  Diagnosis Date   10 year risk of MI or stroke 7.5% or greater 08/08/2016   Allergy    Anxiety    Aortic atherosclerosis (HCC)    Cataracts, bilateral    Coronary atherosclerosis 08/08/2016   Depression    [place]    Osteoporosis    Tobacco abuse 08/08/2016   Past Surgical History:  Procedure Laterality Date   BREAST BIOPSY Left 09/24/2017   benign   CESAREAN SECTION     KNEE SURGERY     x2   OVARIAN CYST REMOVAL     TONSILLECTOMY     Family History  Problem Relation Age of Onset   Diabetes Mother    Hypertension Father    Macular degeneration Father    Vision loss Daughter    Colon cancer Neg Hx    Esophageal cancer Neg Hx    Rectal cancer Neg Hx    Stomach cancer Neg Hx    Social History   Socioeconomic History   Marital status: Married    Spouse name: Not on file   Number of children: Not on file   Years of education: Not on file   Highest education level: Not on file  Occupational History   Not on file  Tobacco Use   Smoking status: Every Day    Packs/day: 0.75    Years: 46.00    Total pack years: 34.50    Types: Cigarettes    Start date: 07/10/1985   Smokeless tobacco: Never  Substance and Sexual Activity   Alcohol use: Yes    Comment: Social   Drug use: No   Sexual activity: Not on file  Other Topics Concern   Not on file  Social History Narrative   Not on file   Social Determinants of Health   Financial Resource Strain: Low Risk  (11/13/2020)   Overall Financial Resource Strain (CARDIA)    Difficulty of Paying Living Expenses: Not hard at all  Food Insecurity: No Food Insecurity (11/13/2020)   Hunger Vital Sign    Worried About Running Out of Food in the Last  Year: Never true    Ran Out of Food in the Last Year: Never true  Transportation Needs: No Transportation Needs (11/13/2020)   PRAPARE - Hydrologist (Medical): No    Lack of Transportation (Non-Medical): No  Physical Activity: Sufficiently Active (11/13/2020)   Exercise Vital Sign    Days of Exercise per Week: 7 days    Minutes of Exercise per Session: 30 min  Stress: No Stress Concern Present (11/13/2020)   [place]    Feeling of Stress : Not at all  Social Connections: Moderately Integrated (11/13/2020)   Social Connection and Isolation Panel [NHANES]    Frequency of Communication with Friends and Family: More than three times a week    Frequency of Social Gatherings with Friends and Family: More than three times a week    Attends Religious Services: More than 4 times per year    Active Member of [employer] or Organizations: No    Attends Music therapist: Never    Marital Status: Married    Tobacco Counseling Ready to quit: Not Answered Counseling given: Not Answered   Clinical Intake:  Pre-visit preparation completed: Yes  Pain : No/denies pain     Diabetes: No  How often do you need to have someone help you when you read instructions, pamphlets, or other written materials from your doctor or pharmacy?: 1 - Never  Diabetic? No  Activities of Daily Living    11/15/2021   11:11 AM  In your present state of health, do you have any difficulty performing the following activities:  Hearing? 0  Vision? 1  Comment has cataract in right eye  Difficulty concentrating or making decisions? 0  Walking or climbing stairs? 1  Dressing or bathing? 0  Doing errands, shopping? 0  Preparing Food and eating ? N  Using the Toilet? N  In the past six months, have you accidently leaked urine? N  Do you have problems with loss of bowel control? N  Managing your Medications? N   Managing your Finances? N  Housekeeping or managing your Housekeeping? N    Patient Care Team: Shelda Pal, DO as PCP - General (Family Medicine)  Indicate any recent Medical Services you may have received from other than [facility] providers in the past year (date may be approximate).     Assessment:   This is a routine wellness examination for Sharon Schmidt.  Hearing/Vision screen No results found.  Dietary issues and exercise activities discussed: Current Exercise Habits: Home exercise routine, Type of exercise: Other -  see comments;walking (been swimming during the summer), Time (Minutes): 30, Frequency (Times/Week): 3, Weekly Exercise (Minutes/Week): 90, Intensity: Mild, Exercise limited by: None identified   Goals Addressed             This Visit's Progress    Patient Stated   On track    Eat healthier       Depression Screen    11/15/2021   11:10 AM 08/22/2021    8:58 AM 11/13/2020   10:31 AM 08/07/2020   12:48 PM 01/19/2020    8:28 AM 07/10/2016    1:51 PM  PHQ 2/9 Scores  PHQ - 2 Score 0 0 0 0 0 0  PHQ- 9 Score  3    0    Fall Risk    11/15/2021   11:04 AM 08/22/2021    8:58 AM 11/13/2020   10:29 AM 08/07/2020   12:48 PM 07/10/2016    1:50 PM  Fall Risk   Falls in the past year? 1 1 0 0 No  Number falls in past yr: 1 0 0 0   Injury with Fall? 0 0 0 0   Risk for fall due to : History of fall(s) No Fall Risks  No Fall Risks   Follow up Falls evaluation completed Falls evaluation completed Falls prevention discussed Falls evaluation completed     [place]:  Any stairs in or around the home? No  If so, are there any without handrails?  No stairs Home free of loose throw rugs in walkways, pet beds, electrical cords, etc? Yes  Adequate lighting in your home to reduce risk of falls? Yes   ASSISTIVE DEVICES UTILIZED TO PREVENT FALLS:  Life alert? No  Use of a cane, walker or w/c? No  Grab bars in the bathroom? Yes  Shower  chair or bench in shower? No  Elevated toilet seat or a handicapped toilet? Yes    Cognitive Function:        11/15/2021   11:20 AM  6CIT Screen  What Year? 0 points  What month? 0 points  What time? 0 points  Count back from 20 0 points  Months in reverse 0 points  Repeat phrase 0 points  Total Score 0 points    Immunizations Immunization History  Administered Date(s) Administered   Influenza, High Dose Seasonal PF 12/20/2016, 12/25/2017, 12/03/2018   Moderna Sars-Covid-2 Vaccination 05/21/2019, 06/21/2019   Pneumococcal Conjugate-13 11/08/2016   Pneumococcal Polysaccharide-23 02/15/2019   Tdap 09/10/2017   Zoster Recombinat (Shingrix) 03/12/2017, 09/10/2017   Zoster, Live 07/10/2012    TDAP status: Up to date  Flu Vaccine status: Due, Education has been provided regarding the importance of this vaccine. Advised may receive this vaccine at local pharmacy or Health Dept. Aware to provide a copy of the vaccination record if obtained from local pharmacy or Health Dept. Verbalized acceptance and understanding.  Pneumococcal vaccine status: Up to date  Covid-19 vaccine status: Information provided on how to obtain vaccines.   Qualifies for Shingles Vaccine? Yes   Zostavax completed Yes   Shingrix Completed?: Yes  Screening Tests Health Maintenance  Topic Date Due   INFLUENZA VACCINE  10/02/2021   COVID-19 Vaccine (3 - Moderna risk series) 02/21/2022 (Originally 07/19/2019)   MAMMOGRAM  07/17/2023   TETANUS/TDAP  09/11/2027   COLONOSCOPY (Pts 45-40yr Insurance coverage will need to be confirmed)  11/21/2027   Pneumonia Vaccine 71 Years old  Completed   DEXA SCAN  Completed  Hepatitis C Screening  Completed   Zoster Vaccines- Shingrix  Completed   HPV VACCINES  Aged Out    Health Maintenance  Health Maintenance Due  Topic Date Due   INFLUENZA VACCINE  10/02/2021    Colorectal cancer screening: Type of screening: Colonoscopy. Completed 11/20/17. Repeat every  10 years  Mammogram status: Completed 07/16/21. Repeat every year  Bone Density status: Completed 08/15/20. Results reflect: Bone density results: OSTEOPOROSIS. Repeat every 2 years.  Lung Cancer Screening: (Low Dose CT Chest recommended if Age 70-80 years, 30 pack-year currently smoking OR have quit w/in 15years.) does qualify.   Lung Cancer Screening Referral: scan done on 07/16/21  Additional Screening:  Hepatitis C Screening: does qualify; Completed 03/05/95  Vision Screening: Recommended annual ophthalmology exams for early detection of glaucoma and other disorders of the eye. Is the patient up to date with their annual eye exam?  Yes  Who is the provider or what is the name of the office in which the patient attends annual eye exams? Dr. Sharen Counter If pt is not established with a provider, would they like to be referred to a provider to establish care? No .   Dental Screening: Recommended annual dental exams for proper oral hygiene  Community Resource Referral / Chronic Care Management: CRR required this visit?  No   CCM required this visit?  No      Plan:     I have personally reviewed and noted the following in the patient's chart:   Medical and social history Use of alcohol, tobacco or illicit drugs  Current medications and supplements including opioid prescriptions. Patient is not currently taking opioid prescriptions. Functional ability and status Nutritional status Physical activity Advanced directives List of other physicians Hospitalizations, surgeries, and ER visits in previous 12 months Vitals Screenings to include cognitive, depression, and falls Referrals and appointments  In addition, I have reviewed and discussed with patient certain preventive protocols, quality metrics, and best practice recommendations. A written personalized care plan for preventive services as well as general preventive health recommendations were provided to patient.   Due to this being  a telephonic visit, the after visit summary with patients personalized plan was offered to patient via mail or my-chart. Patient was mailed a copy of AVS.  Beatris Ship, [place]   11/15/2021   Nurse Notes: None

## 2022-01-22 DIAGNOSIS — D485 Neoplasm of uncertain behavior of skin: Secondary | ICD-10-CM | POA: Diagnosis not present

## 2022-01-22 DIAGNOSIS — L570 Actinic keratosis: Secondary | ICD-10-CM | POA: Diagnosis not present

## 2022-01-22 DIAGNOSIS — C44729 Squamous cell carcinoma of skin of left lower limb, including hip: Secondary | ICD-10-CM | POA: Diagnosis not present

## 2022-02-22 ENCOUNTER — Other Ambulatory Visit: Payer: Self-pay | Admitting: Family Medicine

## 2022-02-22 ENCOUNTER — Ambulatory Visit (INDEPENDENT_AMBULATORY_CARE_PROVIDER_SITE_OTHER): Payer: Medicare HMO | Admitting: Family Medicine

## 2022-02-22 ENCOUNTER — Encounter: Payer: Self-pay | Admitting: Family Medicine

## 2022-02-22 VITALS — BP 134/78 | HR 72 | Temp 97.6°F | Ht 63.0 in | Wt 138.5 lb

## 2022-02-22 DIAGNOSIS — F419 Anxiety disorder, unspecified: Secondary | ICD-10-CM | POA: Diagnosis not present

## 2022-02-22 DIAGNOSIS — I700 Atherosclerosis of aorta: Secondary | ICD-10-CM

## 2022-02-22 DIAGNOSIS — Z79899 Other long term (current) drug therapy: Secondary | ICD-10-CM

## 2022-02-22 DIAGNOSIS — R69 Illness, unspecified: Secondary | ICD-10-CM | POA: Diagnosis not present

## 2022-02-22 LAB — COMPREHENSIVE METABOLIC PANEL
ALT: 14 U/L (ref 0–35)
AST: 19 U/L (ref 0–37)
Albumin: 4.1 g/dL (ref 3.5–5.2)
Alkaline Phosphatase: 64 U/L (ref 39–117)
BUN: 9 mg/dL (ref 6–23)
CO2: 28 mEq/L (ref 19–32)
Calcium: 9.1 mg/dL (ref 8.4–10.5)
Chloride: 99 mEq/L (ref 96–112)
Creatinine, Ser: 0.75 mg/dL (ref 0.40–1.20)
GFR: 80.06 mL/min (ref >60.00)
Glucose, Bld: 96 mg/dL (ref 70–99)
Potassium: 4.1 mEq/L (ref 3.5–5.1)
Sodium: 135 mEq/L (ref 135–145)
Total Bilirubin: 0.6 mg/dL (ref 0.2–1.2)
Total Protein: 6.4 g/dL (ref 6.0–8.3)

## 2022-02-22 LAB — LIPID PANEL
Cholesterol: 185 mg/dL (ref 0–200)
HDL: 77.7 mg/dL (ref >39.00)
LDL Cholesterol: 94 mg/dL (ref 0–99)
NonHDL: 107.74
Total CHOL/HDL Ratio: 2
Triglycerides: 68 mg/dL (ref 0.0–149.0)
VLDL: 13.6 mg/dL (ref 0.0–40.0)

## 2022-02-22 MED ORDER — HYDROCHLOROTHIAZIDE 25 MG PO TABS
25.0000 mg | ORAL_TABLET | Freq: Every day | ORAL | 3 refills | Status: DC
Start: 2022-02-22 — End: 2023-10-22

## 2022-02-22 MED ORDER — DOXEPIN HCL 6 MG PO TABS: ORAL_TABLET | ORAL | 5 refills | Status: DC

## 2022-02-22 NOTE — Patient Instructions (Signed)
Give [place] 2-3 business days to get the results of your labs back.   Keep the diet clean and stay active.  Add some weight resistance exercise to your regimen.   Let us know if you need anything.

## 2022-02-22 NOTE — Progress Notes (Signed)
Chief Complaint  Patient presents with   Follow-up    Subjective: Hyperlipidemia Patient presents for Hyperlipidemia follow up. Currently taking Livalo 1 mg/d and compliance with treatment thus far has been good. She denies myalgias. She is usually adhering to a healthy diet. Exercise: walking The patient is known to have coexisting coronary artery disease. No CP or SOB.   GAD Takes Klonopin 0.5-1 mg qd prn. Reports no AE's. Works well. No SI or HI. No self medication. Not following w counselor/psychologist.   Past Medical History:  Diagnosis Date   10 year risk of MI or stroke 7.5% or greater 08/08/2016   Allergy    Anxiety    Aortic atherosclerosis (HCC)    Cataracts, bilateral    Coronary atherosclerosis 08/08/2016   Depression    Emphysema lung ([place])    Osteoporosis    Tobacco abuse 08/08/2016    Objective: BP 134/78 (BP Location: Left Arm, Patient Position: Sitting, Cuff Size: Normal)   Pulse 72   Temp 97.6 F (36.4 C) (Oral)   Ht '5\' 3"'$  (1.6 m)   Wt 138 lb 8 oz (62.8 kg)   SpO2 97%   BMI 24.53 kg/m  General: Awake, appears stated age Heart: RRR, no LE edema, no bruits Lungs: CTAB, no rales, wheezes or rhonchi. No accessory muscle use Psych: Age appropriate judgment and insight, normal affect and mood  Assessment and Plan: Aortic atherosclerosis (HCC)  Anxiety  Chronic, stable. Cont Livalo 1 mg/d. Counseled on diet/exercise. Ck labs. Chronic, stable. Cont Klonopin prn. CSC and UDS updated today.  F/u in 6 mo. The patient voiced understanding and agreement to the plan.  [place], DO 02/22/22  9:55 AM

## 2022-03-27 DIAGNOSIS — H2513 Age-related nuclear cataract, bilateral: Secondary | ICD-10-CM | POA: Diagnosis not present

## 2022-03-27 DIAGNOSIS — H25013 Cortical age-related cataract, bilateral: Secondary | ICD-10-CM | POA: Diagnosis not present

## 2022-05-09 ENCOUNTER — Other Ambulatory Visit: Payer: Self-pay | Admitting: Family Medicine

## 2022-05-09 DIAGNOSIS — F419 Anxiety disorder, unspecified: Secondary | ICD-10-CM

## 2022-06-11 DIAGNOSIS — Z85828 Personal history of other malignant neoplasm of skin: Secondary | ICD-10-CM | POA: Diagnosis not present

## 2022-06-11 DIAGNOSIS — L814 Other melanin hyperpigmentation: Secondary | ICD-10-CM | POA: Diagnosis not present

## 2022-06-11 DIAGNOSIS — L578 Other skin changes due to chronic exposure to nonionizing radiation: Secondary | ICD-10-CM | POA: Diagnosis not present

## 2022-06-11 DIAGNOSIS — Z859 Personal history of malignant neoplasm, unspecified: Secondary | ICD-10-CM | POA: Diagnosis not present

## 2022-06-11 DIAGNOSIS — L570 Actinic keratosis: Secondary | ICD-10-CM | POA: Diagnosis not present

## 2022-06-11 DIAGNOSIS — L821 Other seborrheic keratosis: Secondary | ICD-10-CM | POA: Diagnosis not present

## 2022-06-17 ENCOUNTER — Other Ambulatory Visit (HOSPITAL_BASED_OUTPATIENT_CLINIC_OR_DEPARTMENT_OTHER): Payer: Self-pay | Admitting: Family Medicine

## 2022-06-17 DIAGNOSIS — Z1231 Encounter for screening mammogram for malignant neoplasm of breast: Secondary | ICD-10-CM

## 2022-07-22 ENCOUNTER — Encounter (HOSPITAL_BASED_OUTPATIENT_CLINIC_OR_DEPARTMENT_OTHER): Payer: Self-pay

## 2022-07-22 ENCOUNTER — Ambulatory Visit (HOSPITAL_BASED_OUTPATIENT_CLINIC_OR_DEPARTMENT_OTHER)
Admission: RE | Admit: 2022-07-22 | Discharge: 2022-07-22 | Disposition: A | Discharge status: Home / Self Care | Payer: Medicare HMO | Source: Ambulatory Visit | Attending: Family Medicine | Admitting: Family Medicine

## 2022-07-22 DIAGNOSIS — Z1231 Encounter for screening mammogram for malignant neoplasm of breast: Secondary | ICD-10-CM | POA: Diagnosis not present

## 2022-08-05 ENCOUNTER — Telehealth: Payer: Self-pay | Admitting: Family Medicine

## 2022-08-05 ENCOUNTER — Other Ambulatory Visit: Payer: Self-pay | Admitting: Family Medicine

## 2022-08-05 DIAGNOSIS — J439 Emphysema, unspecified: Secondary | ICD-10-CM

## 2022-08-05 DIAGNOSIS — E2839 Other primary ovarian failure: Secondary | ICD-10-CM

## 2022-08-05 NOTE — Telephone Encounter (Signed)
Ordered

## 2022-08-05 NOTE — Telephone Encounter (Signed)
Pt said she received a call from Zella Ball that she is due for her bone density and chest scan. Please place order for [facility].

## 2022-08-13 ENCOUNTER — Other Ambulatory Visit: Payer: Self-pay | Admitting: Family Medicine

## 2022-08-13 DIAGNOSIS — F17210 Nicotine dependence, cigarettes, uncomplicated: Secondary | ICD-10-CM

## 2022-08-22 ENCOUNTER — Ambulatory Visit (HOSPITAL_BASED_OUTPATIENT_CLINIC_OR_DEPARTMENT_OTHER)
Admission: RE | Admit: 2022-08-22 | Discharge: 2022-08-22 | Disposition: A | Discharge status: Home / Self Care | Payer: Medicare HMO | Source: Ambulatory Visit | Attending: Family Medicine | Admitting: Family Medicine

## 2022-08-22 DIAGNOSIS — Z122 Encounter for screening for malignant neoplasm of respiratory organs: Secondary | ICD-10-CM | POA: Insufficient documentation

## 2022-08-22 DIAGNOSIS — J439 Emphysema, unspecified: Secondary | ICD-10-CM | POA: Insufficient documentation

## 2022-08-22 DIAGNOSIS — E2839 Other primary ovarian failure: Secondary | ICD-10-CM

## 2022-08-22 DIAGNOSIS — F17210 Nicotine dependence, cigarettes, uncomplicated: Secondary | ICD-10-CM | POA: Diagnosis not present

## 2022-08-22 DIAGNOSIS — M810 Age-related osteoporosis without current pathological fracture: Secondary | ICD-10-CM | POA: Diagnosis not present

## 2022-08-23 DIAGNOSIS — Z91148 Patient's other noncompliance with medication regimen for other reason: Secondary | ICD-10-CM

## 2022-08-23 NOTE — Progress Notes (Signed)
[facility] Customer service manager [facility] Quality Pharmacy Team Statin Quality Measure Assessment   08/23/2022  Sharon Schmidt 08-10-1950 [id number]  Per review of chart and payor information, patient has a diagnosis of diabetes but is not currently filling a statin prescription.  This places patient into the Statin Use In Patients with Diabetes (SUPD) measure for CMS.    According to pharmacy claims, pitavastatin was last filled March 2023, and has not been filled any in 2024 .  The 10-year ASCVD risk score (Arnett DK, et al., 2019) is: 16.7%   Values used to calculate the score:     Age: 72 years     Sex: Female     Is Non-Hispanic African American: No     Diabetic: No     Tobacco smoker: Yes     Systolic Blood Pressure: 134 mmHg     Is BP treated: No     HDL Cholesterol: 77.7 mg/dL     Total Cholesterol: 185 mg/dL 40/98/1191     Component Value Date/Time   CHOL 185 02/22/2022 1025   TRIG 68.0 02/22/2022 1025   HDL 77.70 02/22/2022 1025   CHOLHDL 2 02/22/2022 1025   VLDL 13.6 02/22/2022 1025   LDLCALC 94 02/22/2022 1025    Please consider ONE of the following recommendations:  Initiate moderate intensity statin Atorvastatin 10 mg once daily, #90, 3 refills   Pitavastatin 2 mg once daily, #90, 3 refills    Initiate low intensity          statin with reduced frequency if prior          statin intolerance 1x weekly, #13, 3 refills   2x weekly, #26, 3 refills   3x weekly, #39, 3 refills    Code for past statin intolerance or  other exclusions (required annually)  Provider Requirements: Associate code during an office visit or telehealth encounter  Drug Induced Myopathy G72.0   Myopathy, unspecified G72.9   Myositis, unspecified M60.9   Rhabdomyolysis M62.82   Cirrhosis of liver K74.69   Prediabetes R73.03   PCOS E28.2   Thank you for allowing [facility] pharmacy to be a part of this patient's care. Harlon Flor, PharmD Clinical Pharmacist  [facility] [employer] [phone]

## 2022-08-26 ENCOUNTER — Ambulatory Visit (INDEPENDENT_AMBULATORY_CARE_PROVIDER_SITE_OTHER): Payer: Medicare HMO | Admitting: Family Medicine

## 2022-08-26 ENCOUNTER — Other Ambulatory Visit: Payer: Self-pay | Admitting: Family Medicine

## 2022-08-26 ENCOUNTER — Encounter: Payer: Self-pay | Admitting: Family Medicine

## 2022-08-26 VITALS — BP 130/85 | HR 65 | Temp 98.7°F | Ht 62.0 in | Wt 144.5 lb

## 2022-08-26 DIAGNOSIS — I700 Atherosclerosis of aorta: Secondary | ICD-10-CM | POA: Diagnosis not present

## 2022-08-26 DIAGNOSIS — M25551 Pain in right hip: Secondary | ICD-10-CM

## 2022-08-26 DIAGNOSIS — Z0000 Encounter for general adult medical examination without abnormal findings: Secondary | ICD-10-CM | POA: Diagnosis not present

## 2022-08-26 DIAGNOSIS — Z79899 Other long term (current) drug therapy: Secondary | ICD-10-CM

## 2022-08-26 DIAGNOSIS — F419 Anxiety disorder, unspecified: Secondary | ICD-10-CM

## 2022-08-26 DIAGNOSIS — E871 Hypo-osmolality and hyponatremia: Secondary | ICD-10-CM

## 2022-08-26 DIAGNOSIS — M810 Age-related osteoporosis without current pathological fracture: Secondary | ICD-10-CM

## 2022-08-26 LAB — COMPREHENSIVE METABOLIC PANEL
ALT: 12 U/L (ref 0–35)
AST: 20 U/L (ref 0–37)
Albumin: 4.3 g/dL (ref 3.5–5.2)
Alkaline Phosphatase: 56 U/L (ref 39–117)
BUN: 12 mg/dL (ref 6–23)
CO2: 25 mEq/L (ref 19–32)
Calcium: 9.4 mg/dL (ref 8.4–10.5)
Chloride: 100 mEq/L (ref 96–112)
Creatinine, Ser: 0.82 mg/dL (ref 0.40–1.20)
GFR: 71.67 mL/min (ref >60.00)
Glucose, Bld: 93 mg/dL (ref 70–99)
Potassium: 4.7 mEq/L (ref 3.5–5.1)
Sodium: 132 mEq/L — ABNORMAL LOW (ref 135–145)
Total Bilirubin: 0.5 mg/dL (ref 0.2–1.2)
Total Protein: 6.8 g/dL (ref 6.0–8.3)

## 2022-08-26 LAB — LIPID PANEL
Cholesterol: 193 mg/dL (ref 0–200)
HDL: 67.9 mg/dL (ref >39.00)
LDL Cholesterol: 103 mg/dL — ABNORMAL HIGH (ref 0–99)
NonHDL: 124.91
Total CHOL/HDL Ratio: 3
Triglycerides: 108 mg/dL (ref 0.0–149.0)
VLDL: 21.6 mg/dL (ref 0.0–40.0)

## 2022-08-26 LAB — VITAMIN D 25 HYDROXY (VIT D DEFICIENCY, FRACTURES): VITD: 43.9 ng/mL (ref 30.00–100.00)

## 2022-08-26 MED ORDER — METHYLPREDNISOLONE ACETATE 40 MG/ML IJ SUSP
40.0000 mg | Freq: Once | INTRAMUSCULAR | Status: AC
Start: 2022-08-26 — End: 2022-08-26
  Administered 2022-08-26: 40 mg via INTRA_ARTICULAR

## 2022-08-26 MED ORDER — CLONAZEPAM 1 MG PO TABS
ORAL_TABLET | ORAL | 5 refills | Status: DC
Start: 2022-08-26 — End: 2023-03-10

## 2022-08-26 NOTE — Progress Notes (Signed)
Chief Complaint  Patient presents with   Annual Exam     Well Woman Sharon Schmidt is here for a complete physical.   Her last physical was >1 year ago.  Current diet: in general, a "healthy" diet. Current exercise: walking. Weight is increased a little and she denies daytime fatigue. Seatbelt? Yes Advanced directive? Yes  Health Maintenance Colonoscopy- Yes Shingrix- Yes Lung cancer screening- Due, ordered DEXA- Yes Mammogram- Yes Tetanus- Yes Pneumonia- Yes Hep C screen- Yes  R hip pain She has a history of trochanteric bursitis.  She has received steroid injections in the past that worked for over a year when she does get them.  Over the past few weeks, her hip and back pain have flared up.  No obvious injury or change activity.  No neurologic signs or symptoms.  No bruising, redness, or swelling.  She would like an injection today.  Past Medical History:  Diagnosis Date   10 year risk of MI or stroke 7.5% or greater 08/08/2016   Allergy    Anxiety    Aortic atherosclerosis (HCC)    Cataracts, bilateral    Coronary atherosclerosis 08/08/2016   Depression    Emphysema lung (HCC)    Osteoporosis    Tobacco abuse 08/08/2016     Past Surgical History:  Procedure Laterality Date   BREAST BIOPSY Left 09/24/2017   benign   CESAREAN SECTION     KNEE SURGERY     x2   OVARIAN CYST REMOVAL     TONSILLECTOMY      Medications  Current Outpatient Medications on File Prior to Visit  Medication Sig Dispense Refill   Cholecalciferol (VITAMIN D PO) Take by mouth. Take 1 gel cap by mouth daily.     diclofenac Sodium (VOLTAREN) 1 % GEL Apply 2 g topically 4 (four) times daily. 100 g 0   Doxepin HCl 6 MG TABS Take 1 tab nightly as needed for sleep. 30 tablet 5   hydrochlorothiazide (HYDRODIURIL) 25 MG tablet Take 1 tablet (25 mg total) by mouth daily. 90 tablet 3   ibuprofen (ADVIL,MOTRIN) 200 MG tablet Take 200 mg by mouth at bedtime as needed.     Magnesium 250 MG TABS Take by  mouth.     Menthol, Topical Analgesic, (ICY HOT EX) Apply topically.     NON FORMULARY      OVER THE COUNTER MEDICATION Multivitamin Chewable-Take 1 chewable daily.     OVER THE COUNTER MEDICATION Antiacid-Take 1 tablet by mouth as needed.     Pitavastatin Calcium 1 MG TABS Take 1 tablet (1 mg total) by mouth daily. 90 tablet 3   vitamin E 400 UNIT capsule Take 800 Units by mouth daily.      Allergies Allergies  Allergen Reactions   Augmentin [Amoxicillin-Pot Clavulanate] Nausea Only   Fosamax [Alendronate]     N/V/stomach upset   Statins     Myalgias    Review of Systems: Constitutional:  no fevers Eye:  no recent significant change in vision Ears:  No changes in hearing Nose/Mouth/Throat:  no complaints of nasal congestion, no sore throat Cardiovascular: no chest pain Respiratory:  No shortness of breath Gastrointestinal:  No change in bowel habits GU:  Female: negative for dysuria Integumentary:  no abnormal skin lesions reported MSK: + Right hip pain Neurologic:  no headaches Endocrine:  denies unexplained weight changes  Exam BP 130/85 (BP Location: Left Arm, Patient Position: Sitting, Cuff Size: Normal)   Pulse 65   Temp  98.7 F (37.1 C) (Oral)   Ht 5\' 2"  (1.575 m)   Wt 144 lb 8 oz (65.5 kg)   SpO2 97%   BMI 26.43 kg/m  General:  well developed, well nourished, in no apparent distress Skin:  no significant moles, warts, or growths Head:  no masses, lesions, or tenderness Eyes:  pupils equal and round, sclera anicteric without injection Ears:  canals without lesions, TMs shiny without retraction, no obvious effusion, no erythema Nose:  nares patent, mucosa normal, and no drainage Throat/Pharynx:  lips and gingiva without lesion; tongue and uvula midline; non-inflamed pharynx; no exudates or postnasal drainage Neck: neck supple without adenopathy, thyromegaly, or masses Lungs:  clear to auscultation, breath sounds equal bilaterally, no respiratory  distress Cardio:  regular rate and rhythm, no bruits or LE edema MSK: TTP over the right greater trochanter.  No deformity or asymmetry. Abdomen:  abdomen soft, nontender; bowel sounds normal; no masses or organomegaly Genital: Deferred Neuro:  gait normal; deep tendon reflexes normal and symmetric Psych: well oriented with normal range of affect and appropriate judgment/insight  Procedure note: Greater trochanteric bursa injection Verbal consent obtained. The area of interest was palpated and demarcated with an otoscope speculum. It was cleaned with an alcohol swab. Freeze spray was used. A 27 g needle was inserted at a perpendicular angle through the area of interested. The plunger was withdrawn to ensure our placement was not in a vessel. 2 mL of 1% lidocaine without epi and 40 mg of Depomedrol was injected. A bandaid was placed. The patient tolerated the procedure well.  There were no complications noted.   Assessment and Plan  Well adult exam  Aortic atherosclerosis (HCC) - Plan: Comprehensive metabolic panel, Lipid panel  Age-related osteoporosis without current pathological fracture - Plan: VITAMIN D 25 Hydroxy (Vit-D Deficiency, Fractures)  Encounter for long-term (current) use of high-risk medication - Plan: Drug Monitoring Panel [phone] , Urine  Anxiety - Plan: clonazePAM (KLONOPIN) 1 MG tablet  Right hip pain - Plan: PR ARTHROCENTESIS ASPIR&/INJ MAJOR JT/BURSA W/O [place], methylPREDNISolone acetate (DEPO-MEDROL) injection 40 mg   Well 72 y.o. female. Counseled on diet and exercise. Other orders as above. UDS updated.  Right hip pain: Chronic, not controlled.  Bursa injection today.  Heat, ice, Tylenol.  Consider physical therapy versus sports medicine referral if no improvement. Follow up in 6 mo or prn. The patient voiced understanding and agreement to the plan.  Jilda Roche [place], DO 08/26/22 11:50 AM

## 2022-08-26 NOTE — Patient Instructions (Addendum)
Give us 2-3 business days to get the results of your labs back.   Keep the diet clean and stay active.  Let us know if you need anything. 

## 2022-08-28 LAB — DRUG MONITORING PANEL 376104, URINE
Amphetamines: NEGATIVE ng/mL (ref <500)
Barbiturates: NEGATIVE ng/mL (ref <300)
Benzodiazepines: NEGATIVE ng/mL (ref <100)
Cocaine Metabolite: NEGATIVE ng/mL (ref <150)
Desmethyltramadol: NEGATIVE ng/mL (ref <100)
Opiates: NEGATIVE ng/mL (ref <100)
Oxycodone: NEGATIVE ng/mL (ref <100)
Tramadol: NEGATIVE ng/mL (ref <100)

## 2022-08-28 LAB — DM TEMPLATE

## 2022-08-29 ENCOUNTER — Other Ambulatory Visit (INDEPENDENT_AMBULATORY_CARE_PROVIDER_SITE_OTHER): Payer: Medicare HMO

## 2022-08-29 DIAGNOSIS — E871 Hypo-osmolality and hyponatremia: Secondary | ICD-10-CM | POA: Diagnosis not present

## 2022-08-29 LAB — BASIC METABOLIC PANEL
BUN: 10 mg/dL (ref 6–23)
CO2: 29 mEq/L (ref 19–32)
Calcium: 9.6 mg/dL (ref 8.4–10.5)
Chloride: 99 mEq/L (ref 96–112)
Creatinine, Ser: 0.83 mg/dL (ref 0.40–1.20)
GFR: 70.63 mL/min (ref >60.00)
Glucose, Bld: 94 mg/dL (ref 70–99)
Potassium: 4.2 mEq/L (ref 3.5–5.1)
Sodium: 135 mEq/L (ref 135–145)

## 2022-10-01 DIAGNOSIS — H524 Presbyopia: Secondary | ICD-10-CM | POA: Diagnosis not present

## 2022-10-17 ENCOUNTER — Encounter (INDEPENDENT_AMBULATORY_CARE_PROVIDER_SITE_OTHER): Payer: Self-pay

## 2022-11-19 ENCOUNTER — Ambulatory Visit (INDEPENDENT_AMBULATORY_CARE_PROVIDER_SITE_OTHER): Payer: Medicare HMO | Admitting: *Deleted

## 2022-11-19 VITALS — BP 132/86 | Ht 62.0 in | Wt 144.0 lb

## 2022-11-19 DIAGNOSIS — Z0000 Encounter for general adult medical examination without abnormal findings: Secondary | ICD-10-CM

## 2022-11-19 NOTE — Progress Notes (Signed)
Subjective:   Sharon Schmidt is a 72 y.o. female who presents for Medicare Annual (Subsequent) preventive examination.  Visit Complete: Virtual  I connected with  Sharon Schmidt on 11/19/22 by a audio enabled telemedicine application and verified that I am speaking with the correct person using two identifiers.  Patient Location: Home  Provider Location: Office/Clinic  I discussed the limitations of evaluation and management by telemedicine. The patient expressed understanding and agreed to proceed.   Cardiac Risk Factors include: advanced age (>27men, >78 women);dyslipidemia     Objective:   Vital Signs: Vital signs are patient reported.  Today's Vitals   11/19/22 1117  BP: 132/86  Weight: 144 lb (65.3 kg)  Height: 5\' 2"  (1.575 m)   Body mass index is 26.34 kg/m.     11/19/2022   11:11 AM 11/15/2021   11:04 AM 11/13/2020   10:27 AM 12/17/2016    9:32 AM  Advanced Directives  Does Patient Have a Medical Advance Directive? Yes Yes Yes No  Type of Diplomatic Services operational officer Living will Healthcare Power of [place];Living will   Does patient want to make changes to medical advance directive? No - Patient declined     Copy of Healthcare Power of Attorney in Chart?   No - copy requested     Current Medications (verified) Outpatient Encounter Medications as of 11/19/2022  Medication Sig   aspirin EC 81 MG tablet Take 81 mg by mouth daily. Swallow whole.   Cholecalciferol (VITAMIN D PO) Take by mouth. Take 1 gel cap by mouth daily.   clonazePAM (KLONOPIN) 1 MG tablet TAKE 1/2 TO 1 (ONE-HALF TO ONE) TABLET BY MOUTH TWICE DAILY   diclofenac Sodium (VOLTAREN) 1 % GEL Apply 2 g topically 4 (four) times daily.   Doxepin HCl 6 MG TABS Take 1 tab nightly as needed for sleep.   hydrochlorothiazide (HYDRODIURIL) 25 MG tablet Take 1 tablet (25 mg total) by mouth daily.   ibuprofen (ADVIL,MOTRIN) 200 MG tablet Take 200 mg by mouth at bedtime as needed.   Magnesium  250 MG TABS Take by mouth.   Menthol, Topical Analgesic, (ICY HOT EX) Apply topically.   NON FORMULARY    OVER THE COUNTER MEDICATION Multivitamin Chewable-Take 1 chewable daily.   OVER THE COUNTER MEDICATION Antiacid-Take 1 tablet by mouth as needed.   Pitavastatin Calcium 1 MG TABS Take 1 tablet (1 mg total) by mouth daily.   vitamin E 400 UNIT capsule Take 800 Units by mouth daily.   No facility-administered encounter medications on file as of 11/19/2022.    Allergies (verified) Augmentin [amoxicillin-pot clavulanate], Fosamax [alendronate], and Statins   History: Past Medical History:  Diagnosis Date   10 year risk of MI or stroke 7.5% or greater 08/08/2016   Allergy    Anxiety    Aortic atherosclerosis (HCC)    Cataracts, bilateral    Coronary atherosclerosis 08/08/2016   Depression    Emphysema lung (HCC)    Osteoporosis    Tobacco abuse 08/08/2016   Past Surgical History:  Procedure Laterality Date   BREAST BIOPSY Left 09/24/2017   benign   CESAREAN SECTION     KNEE SURGERY     x2   OVARIAN CYST REMOVAL     TONSILLECTOMY     Family History  Problem Relation Age of Onset   Diabetes Mother    Hypertension Father    Macular degeneration Father    Vision loss Daughter    Colon cancer Neg  Hx    Esophageal cancer Neg Hx    Rectal cancer Neg Hx    Stomach cancer Neg Hx    Social History   Socioeconomic History   Marital status: Married    Spouse name: Not on file   Number of children: Not on file   Years of education: Not on file   Highest education level: Not on file  Occupational History   Not on file  Tobacco Use   Smoking status: Every Day    Current packs/day: 0.75    Average packs/day: 0.8 packs/day for 46.0 years (34.5 ttl pk-yrs)    Types: Cigarettes    Start date: 07/10/1985   Smokeless tobacco: Never  Substance and Sexual Activity   Alcohol use: Yes    Comment: Social   Drug use: No   Sexual activity: Not on file  Other Topics Concern   Not on  file  Social History Narrative   Not on file   Social Determinants of Health   Financial Resource Strain: Low Risk  (11/19/2022)   Overall Financial Resource Strain (CARDIA)    Difficulty of Paying Living Expenses: Not hard at all  Food Insecurity: No Food Insecurity (11/19/2022)   Hunger Vital Sign    Worried About Running Out of Food in the Last Year: Never true    Ran Out of Food in the Last Year: Never true  Transportation Needs: No Transportation Needs (11/19/2022)   PRAPARE - Administrator, Civil Service (Medical): No    Lack of Transportation (Non-Medical): No  Physical Activity: Insufficiently Active (11/19/2022)   Exercise Vital Sign    Days of Exercise per Week: 7 days    Minutes of Exercise per Session: 20 min  Stress: No Stress Concern Present (11/19/2022)   [employer] of Occupational Health - Occupational Stress Questionnaire    Feeling of Stress : Not at all  Social Connections: Moderately Isolated (11/19/2022)   Social Connection and Isolation Panel [NHANES]    Frequency of Communication with Friends and Family: More than three times a week    Frequency of Social Gatherings with Friends and Family: More than three times a week    Attends Religious Services: Never    Database administrator or Organizations: No    Attends Engineer, structural: Never    Marital Status: Married    Tobacco Counseling Ready to quit: Not Answered Counseling given: Not Answered   Clinical Intake:  Pre-visit preparation completed: Yes  Pain : No/denies pain  BMI - recorded: 26.34 Nutritional Status: BMI 25 -29 Overweight Nutritional Risks: None Diabetes: No  How often do you need to have someone help you when you read instructions, pamphlets, or other written materials from your doctor or pharmacy?: 1 - Never  Interpreter Needed?: No  Information entered by :: Donne Anon, CMA   Activities of Daily Living    11/19/2022   11:05 AM  In your  present state of health, do you have any difficulty performing the following activities:  Hearing? 0  Vision? 0  Difficulty concentrating or making decisions? 0  Walking or climbing stairs? 1  Dressing or bathing? 0  Doing errands, shopping? 0  Preparing Food and eating ? N  Using the Toilet? N  In the past six months, have you accidently leaked urine? N  Do you have problems with loss of bowel control? N  Managing your Medications? N  Managing your Finances? N  Housekeeping or managing your  Housekeeping? N    Patient Care Team: Sharlene Dory, DO as PCP - General (Family Medicine)  Indicate any recent Medical Services you may have received from other than [facility] providers in the past year (date may be approximate).     Assessment:   This is a routine wellness examination for Sharon Schmidt.  Hearing/Vision screen No results found.   Goals Addressed   None    Depression Screen    11/19/2022   11:13 AM 08/26/2022   10:03 AM 11/15/2021   11:10 AM 08/22/2021    8:58 AM 11/13/2020   10:31 AM 08/07/2020   12:48 PM 01/19/2020    8:28 AM  PHQ 2/9 Scores  PHQ - 2 Score 0 0 0 0 0 0 0  PHQ- 9 Score  0  3       Fall Risk    11/19/2022   11:05 AM 08/26/2022   10:03 AM 11/15/2021   11:04 AM 08/22/2021    8:58 AM 11/13/2020   10:29 AM  Fall Risk   Falls in the past year? 1 0 1 1 0  Number falls in past yr: 0 0 1 0 0  Injury with Fall? 0 0 0 0 0  Risk for fall due to : Impaired balance/gait No Fall Risks History of fall(s) No Fall Risks   Follow up Falls evaluation completed Falls evaluation completed Falls evaluation completed Falls evaluation completed Falls prevention discussed    MEDICARE RISK AT HOME: Medicare Risk at Home Any stairs in or around the home?: No If so, are there any without handrails?: No Home free of loose throw rugs in walkways, pet beds, electrical cords, etc?: Yes Adequate lighting in your home to reduce risk of falls?: Yes Life alert?: No Use of a  cane, walker or w/c?: Yes Grab bars in the bathroom?: Yes Shower chair or bench in shower?: No Elevated toilet seat or a handicapped toilet?: Yes  TIMED UP AND GO:  Was the test performed?  No    Cognitive Function:        11/19/2022   11:18 AM 11/15/2021   11:20 AM  6CIT Screen  What Year? 0 points 0 points  What month? 0 points 0 points  What time? 0 points 0 points  Count back from 20 0 points 0 points  Months in reverse 0 points 0 points  Repeat phrase 0 points 0 points  Total Score 0 points 0 points    Immunizations Immunization History  Administered Date(s) Administered   Influenza, High Dose Seasonal PF 12/20/2016, 12/25/2017, 12/03/2018   Moderna Sars-Covid-2 Vaccination 05/21/2019, 06/21/2019   Pneumococcal Conjugate-13 11/08/2016   Pneumococcal Polysaccharide-23 02/15/2019   Tdap 09/10/2017   Zoster Recombinant(Shingrix) 03/12/2017, 09/10/2017   Zoster, Live 07/10/2012    TDAP status: Up to date  Flu Vaccine status: Due, Education has been provided regarding the importance of this vaccine. Advised may receive this vaccine at local pharmacy or Health Dept. Aware to provide a copy of the vaccination record if obtained from local pharmacy or Health Dept. Verbalized acceptance and understanding.  Pneumococcal vaccine status: Up to date  Covid-19 vaccine status: Information provided on how to obtain vaccines.   Qualifies for Shingles Vaccine? Yes   Zostavax completed Yes   Shingrix Completed?: Yes  Screening Tests Health Maintenance  Topic Date Due   INFLUENZA VACCINE  10/03/2022   Medicare Annual Wellness (AWV)  11/16/2022   Lung Cancer Screening  08/22/2023   MAMMOGRAM  07/21/2024  DTaP/Tdap/Td (2 - Td or Tdap) 09/11/2027   Colonoscopy  11/21/2027   Pneumonia Vaccine 56+ Years old  Completed   DEXA SCAN  Completed   Hepatitis C Screening  Completed   Zoster Vaccines- Shingrix  Completed   HPV VACCINES  Aged Out   COVID-19 Vaccine  Discontinued     Health Maintenance  Health Maintenance Due  Topic Date Due   INFLUENZA VACCINE  10/03/2022   Medicare Annual Wellness (AWV)  11/16/2022    Colorectal cancer screening: Type of screening: Colonoscopy. Completed 11/20/17. Repeat every 10 years  Mammogram status: Completed 07/22/22. Repeat every year  Bone Density status: Completed 08/22/22. Results reflect: Bone density results: OSTEOPOROSIS. Repeat every 2 years.  Lung Cancer Screening: (Low Dose CT Chest recommended if Age 68-80 years, 20 pack-year currently smoking OR have quit w/in 15years.) does qualify.   Lung Cancer Screening Referral: last scan completed on 08/22/22  Additional Screening:  Hepatitis C Screening: does qualify; Completed 03/05/95  Vision Screening: Recommended annual ophthalmology exams for early detection of glaucoma and other disorders of the eye. Is the patient up to date with their annual eye exam?  Yes  Who is the provider or what is the name of the office in which the patient attends annual eye exams? Dr. Cephus Richer If pt is not established with a provider, would they like to be referred to a provider to establish care? No .   Dental Screening: Recommended annual dental exams for proper oral hygiene  Diabetic Foot Exam: N/a  Community Resource Referral / Chronic Care Management: CRR required this visit?  No   CCM required this visit?  No     Plan:     I have personally reviewed and noted the following in the patient's chart:   Medical and social history Use of alcohol, tobacco or illicit drugs  Current medications and supplements including opioid prescriptions. Patient is not currently taking opioid prescriptions. Functional ability and status Nutritional status Physical activity Advanced directives List of other physicians Hospitalizations, surgeries, and ER visits in previous 12 months Vitals Screenings to include cognitive, depression, and falls Referrals and appointments  In  addition, I have reviewed and discussed with patient certain preventive protocols, quality metrics, and best practice recommendations. A written personalized care plan for preventive services as well as general preventive health recommendations were provided to patient.     Donne Anon, CMA   11/19/2022   After Visit Summary: (MyChart) Due to this being a telephonic visit, the after visit summary with patients personalized plan was offered to patient via MyChart   Nurse Notes: None

## 2022-11-19 NOTE — Patient Instructions (Signed)
Sharon Schmidt , Thank you for taking time to come for your Medicare Wellness Visit. I appreciate your ongoing commitment to your health goals. Please review the following plan we discussed and let me know if I can assist you in the future.     This is a list of the screening recommended for you and due dates:  Health Maintenance  Topic Date Due   Flu Shot  06/02/2023*   Screening for Lung Cancer  08/22/2023   Medicare Annual Wellness Visit  11/19/2023   Mammogram  07/21/2024   DTaP/Tdap/Td vaccine (2 - Td or Tdap) 09/11/2027   Colon Cancer Screening  11/21/2027   Pneumonia Vaccine  Completed   DEXA scan (bone density measurement)  Completed   Hepatitis C Screening  Completed   Zoster (Shingles) Vaccine  Completed   HPV Vaccine  Aged Out   COVID-19 Vaccine  Discontinued  *Topic was postponed. The date shown is not the original due date.    Next appointment: Follow up in one year for your annual wellness visit.   Preventive Care 72 Years and Older, Female Preventive care refers to lifestyle choices and visits with your health care provider that can promote health and wellness. What does preventive care include? A yearly physical exam. This is also called an annual well check. Dental exams once or twice a year. Routine eye exams. Ask your health care provider how often you should have your eyes checked. Personal lifestyle choices, including: Daily care of your teeth and gums. Regular physical activity. Eating a healthy diet. Avoiding tobacco and drug use. Limiting alcohol use. Practicing safe sex. Taking low-dose aspirin every day. Taking vitamin and mineral supplements as recommended by your health care provider. What happens during an annual well check? The services and screenings done by your health care provider during your annual well check will depend on your age, overall health, lifestyle risk factors, and family history of disease. Counseling  Your health care provider  may ask you questions about your: Alcohol use. Tobacco use. Drug use. Emotional well-being. Home and relationship well-being. Sexual activity. Eating habits. History of falls. Memory and ability to understand (cognition). Work and work Astronomer. Reproductive health. Screening  You may have the following tests or measurements: Height, weight, and BMI. Blood pressure. Lipid and cholesterol levels. These may be checked every 5 years, or more frequently if you are over 64 years old. Skin check. Lung cancer screening. You may have this screening every year starting at age 38 if you have a 30-pack-year history of smoking and currently smoke or have quit within the past 15 years. Fecal occult blood test (FOBT) of the stool. You may have this test every year starting at age 67. Flexible sigmoidoscopy or colonoscopy. You may have a sigmoidoscopy every 5 years or a colonoscopy every 10 years starting at age 75. Hepatitis C blood test. Hepatitis B blood test. Sexually transmitted disease (STD) testing. Diabetes screening. This is done by checking your blood sugar (glucose) after you have not eaten for a while (fasting). You may have this done every 1-3 years. Bone density scan. This is done to screen for osteoporosis. You may have this done starting at age 10. Mammogram. This may be done every 1-2 years. Talk to your health care provider about how often you should have regular mammograms. Talk with your health care provider about your test results, treatment options, and if necessary, the need for more tests. Vaccines  Your health care provider may recommend certain  vaccines, such as: Influenza vaccine. This is recommended every year. Tetanus, diphtheria, and acellular pertussis (Tdap, Td) vaccine. You may need a Td booster every 10 years. Zoster vaccine. You may need this after age 14. Pneumococcal 13-valent conjugate (PCV13) vaccine. One dose is recommended after age 37. Pneumococcal  polysaccharide (PPSV23) vaccine. One dose is recommended after age 30. Talk to your health care provider about which screenings and vaccines you need and how often you need them. This information is not intended to replace advice given to you by your health care provider. Make sure you discuss any questions you have with your health care provider. Document Released: 03/17/2015 Document Revised: 11/08/2015 Document Reviewed: 12/20/2014 Elsevier Interactive Patient Education  2017 [employer].  Fall Prevention in the Home Falls can cause injuries. They can happen to people of all ages. There are many things you can do to make your home safe and to help prevent falls. What can I do on the outside of my home? Regularly fix the edges of walkways and driveways and fix any cracks. Remove anything that might make you trip as you walk through a door, such as a raised step or threshold. Trim any bushes or trees on the path to your home. Use bright outdoor lighting. Clear any walking paths of anything that might make someone trip, such as rocks or tools. Regularly check to see if handrails are loose or broken. Make sure that both sides of any steps have handrails. Any raised decks and porches should have guardrails on the edges. Have any leaves, snow, or ice cleared regularly. Use sand or salt on walking paths during winter. Clean up any spills in your garage right away. This includes oil or grease spills. What can I do in the bathroom? Use night lights. Install grab bars by the toilet and in the tub and shower. Do not use towel bars as grab bars. Use non-skid mats or decals in the tub or shower. If you need to sit down in the shower, use a plastic, non-slip stool. Keep the floor dry. Clean up any water that spills on the floor as soon as it happens. Remove soap buildup in the tub or shower regularly. Attach bath mats securely with double-sided non-slip rug tape. Do not have throw rugs and other  things on the floor that can make you trip. What can I do in the bedroom? Use night lights. Make sure that you have a light by your bed that is easy to reach. Do not use any sheets or blankets that are too big for your bed. They should not hang down onto the floor. Have a firm chair that has side arms. You can use this for support while you get dressed. Do not have throw rugs and other things on the floor that can make you trip. What can I do in the kitchen? Clean up any spills right away. Avoid walking on wet floors. Keep items that you use a lot in easy-to-reach places. If you need to reach something above you, use a strong step stool that has a grab bar. Keep electrical cords out of the way. Do not use floor polish or wax that makes floors slippery. If you must use wax, use non-skid floor wax. Do not have throw rugs and other things on the floor that can make you trip. What can I do with my stairs? Do not leave any items on the stairs. Make sure that there are handrails on both sides of the stairs  and use them. Fix handrails that are broken or loose. Make sure that handrails are as long as the stairways. Check any carpeting to make sure that it is firmly attached to the stairs. Fix any carpet that is loose or worn. Avoid having throw rugs at the top or bottom of the stairs. If you do have throw rugs, attach them to the floor with carpet tape. Make sure that you have a light switch at the top of the stairs and the bottom of the stairs. If you do not have them, ask someone to add them for you. What else can I do to help prevent falls? Wear shoes that: Do not have high heels. Have rubber bottoms. Are comfortable and fit you well. Are closed at the toe. Do not wear sandals. If you use a stepladder: Make sure that it is fully opened. Do not climb a closed stepladder. Make sure that both sides of the stepladder are locked into place. Ask someone to hold it for you, if possible. Clearly  mark and make sure that you can see: Any grab bars or handrails. First and last steps. Where the edge of each step is. Use tools that help you move around (mobility aids) if they are needed. These include: Canes. Walkers. Scooters. Crutches. Turn on the lights when you go into a dark area. Replace any light bulbs as soon as they burn out. Set up your furniture so you have a clear path. Avoid moving your furniture around. If any of your floors are uneven, fix them. If there are any pets around you, be aware of where they are. Review your medicines with your doctor. Some medicines can make you feel dizzy. This can increase your chance of falling. Ask your doctor what other things that you can do to help prevent falls. This information is not intended to replace advice given to you by your health care provider. Make sure you discuss any questions you have with your health care provider. Document Released: 12/15/2008 Document Revised: 07/27/2015 Document Reviewed: 03/25/2014 Elsevier Interactive Patient Education  2017 [employer].

## 2022-12-12 DIAGNOSIS — L578 Other skin changes due to chronic exposure to nonionizing radiation: Secondary | ICD-10-CM | POA: Diagnosis not present

## 2022-12-12 DIAGNOSIS — L7211 Pilar cyst: Secondary | ICD-10-CM | POA: Diagnosis not present

## 2022-12-12 DIAGNOSIS — Z85828 Personal history of other malignant neoplasm of skin: Secondary | ICD-10-CM | POA: Diagnosis not present

## 2022-12-12 DIAGNOSIS — L814 Other melanin hyperpigmentation: Secondary | ICD-10-CM | POA: Diagnosis not present

## 2022-12-12 DIAGNOSIS — L820 Inflamed seborrheic keratosis: Secondary | ICD-10-CM | POA: Diagnosis not present

## 2022-12-12 DIAGNOSIS — L821 Other seborrheic keratosis: Secondary | ICD-10-CM | POA: Diagnosis not present

## 2022-12-12 DIAGNOSIS — Z859 Personal history of malignant neoplasm, unspecified: Secondary | ICD-10-CM | POA: Diagnosis not present

## 2022-12-12 DIAGNOSIS — D239 Other benign neoplasm of skin, unspecified: Secondary | ICD-10-CM | POA: Diagnosis not present

## 2022-12-17 ENCOUNTER — Telehealth: Payer: Self-pay

## 2022-12-17 NOTE — Telephone Encounter (Signed)
-----   Message from [facility] Robin E sent at 12/17/2022  1:22 PM EDT -----  ----- Message ----- From: Pincus Sanes, CMA Sent: 08/03/2023  12:00 AM EDT To: Vladimir Crofts Ewing, CMA  CT CHEST LUNG CA SCREEN LOW DOSE W/O CM in 1 year--08/03/2023

## 2023-02-20 IMAGING — MG MM DIGITAL SCREENING BILAT W/ TOMO AND CAD
8 series · 8 of 24 positions shown · non-contrast
Comparison: Previous exam(s).

CLINICAL DATA: Screening.

EXAM:
DIGITAL SCREENING BILATERAL MAMMOGRAM WITH TOMOSYNTHESIS AND CAD
TECHNIQUE: Bilateral screening digital craniocaudal and mediolateral oblique
mammograms were obtained. Bilateral screening digital breast
tomosynthesis was performed. The images were evaluated with
computer-aided detection.

[R MLO synth-2D]
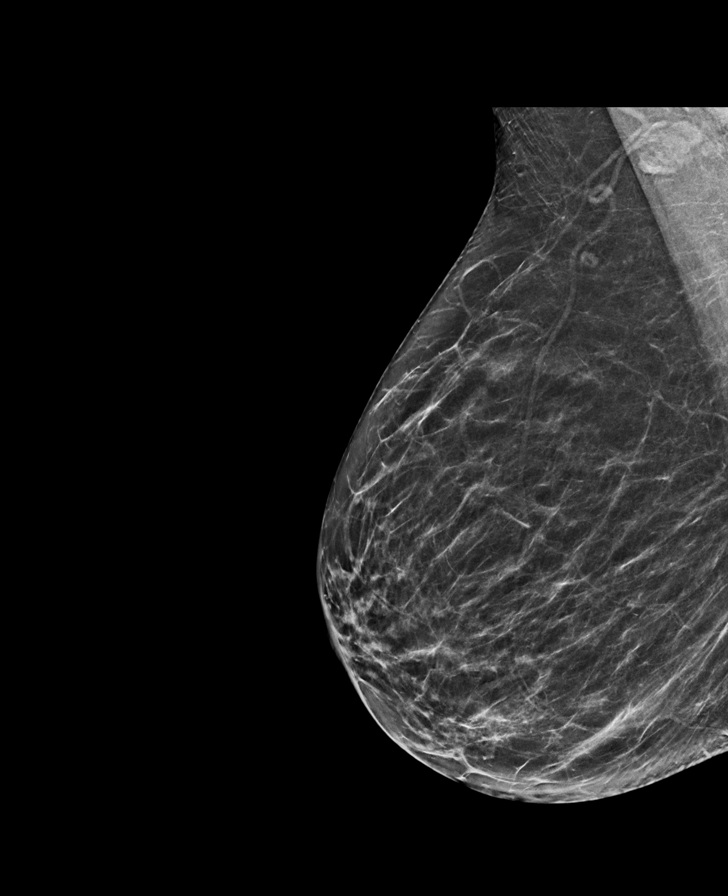

[L MLO synth-2D]
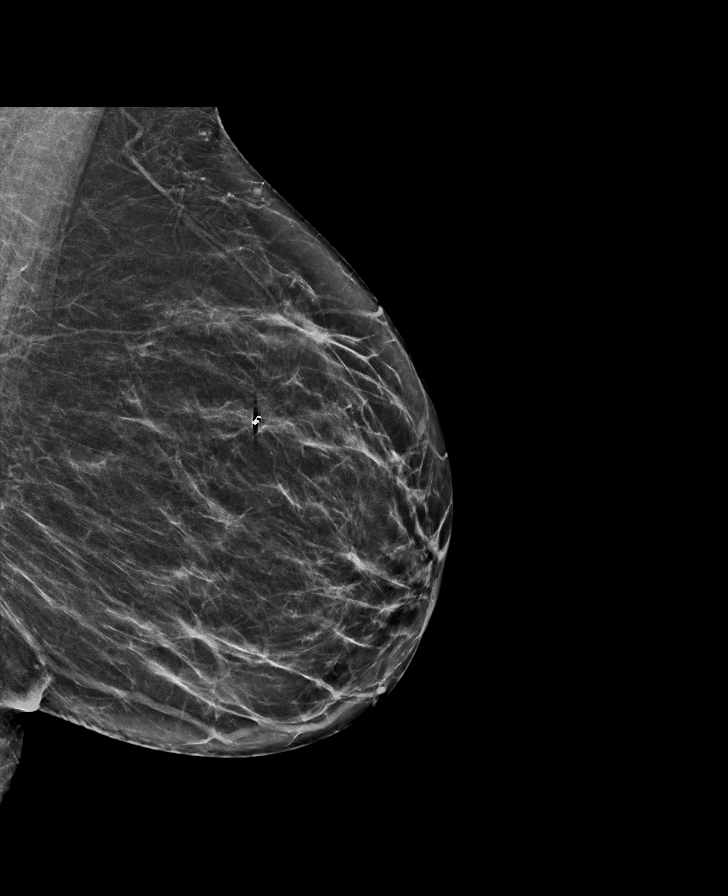

[R CC synth-2D]
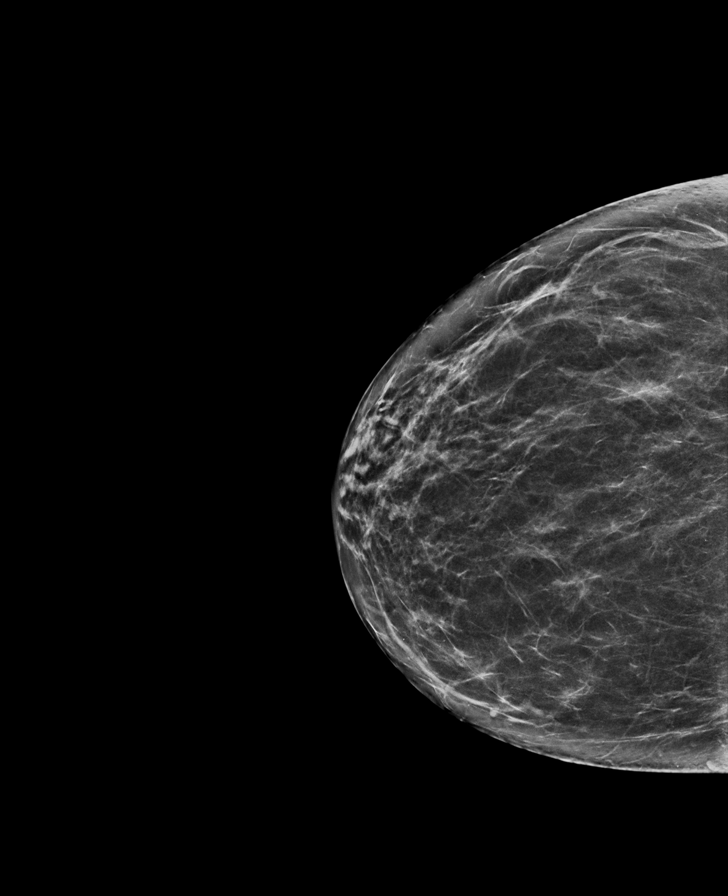

[L CC synth-2D]
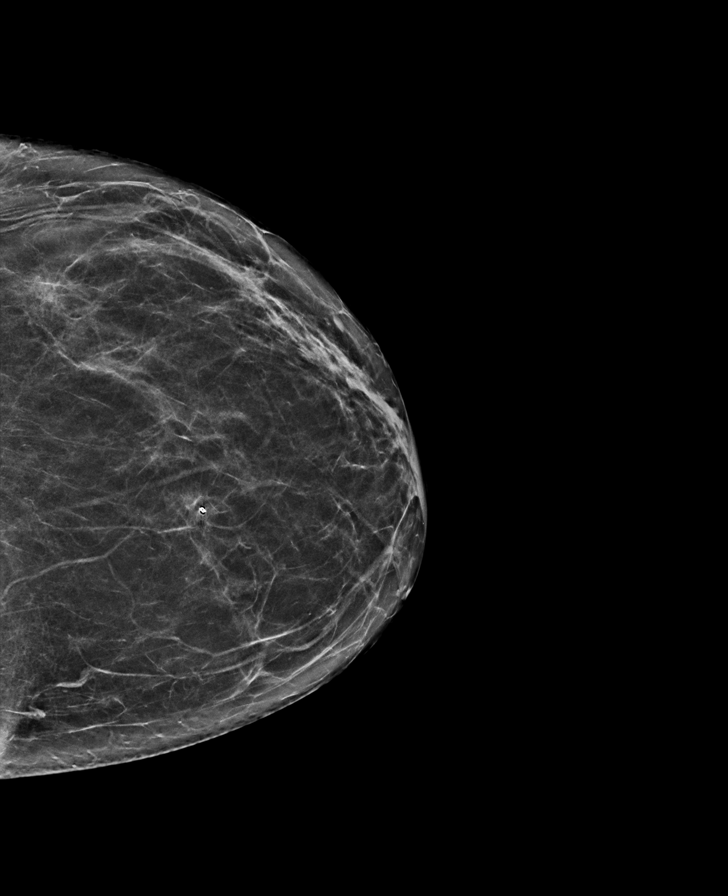

[R CC tomo · tomo slice 33/65.0]
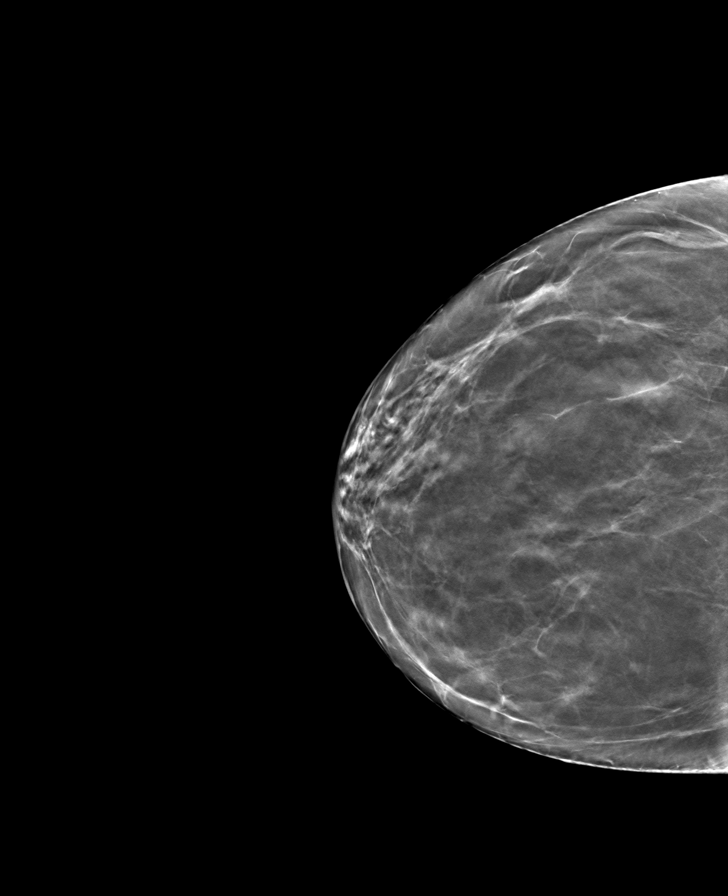

[R MLO tomo · tomo slice 34/67.0]
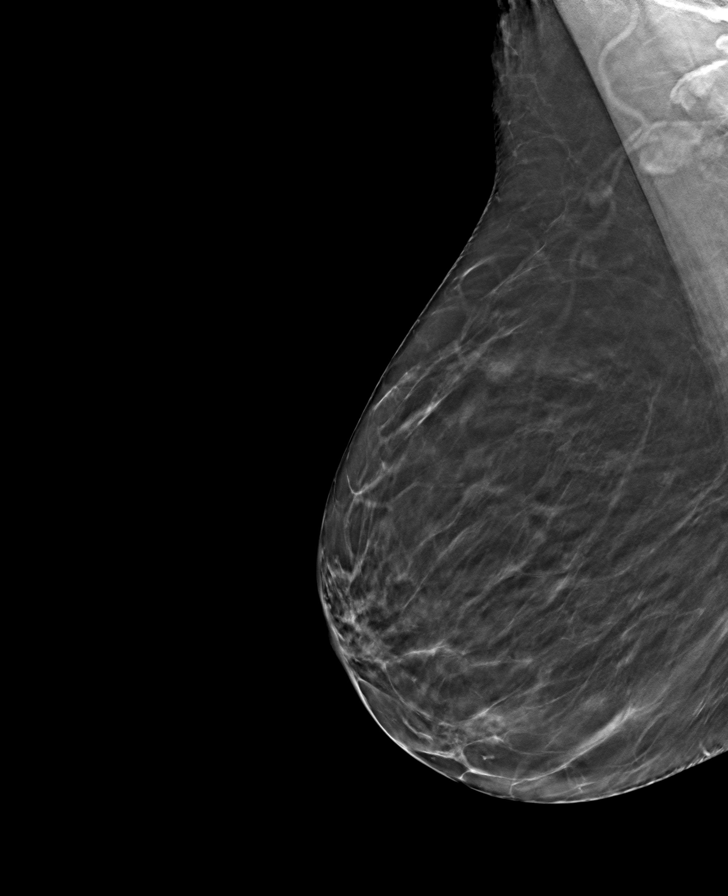

[L CC tomo · tomo slice 31/60.0]
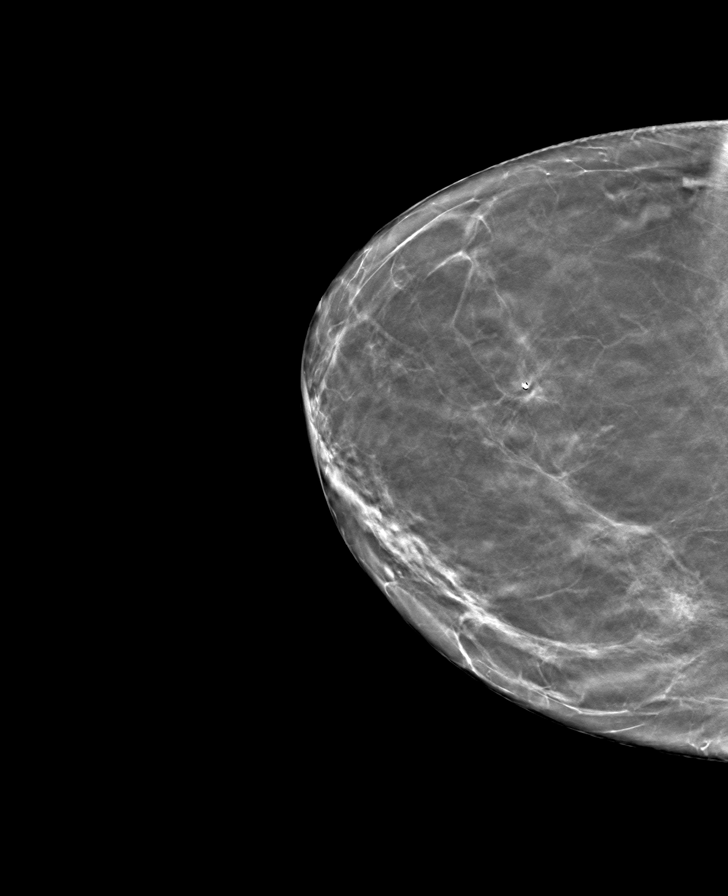

[L MLO tomo · tomo slice 32/63.0]
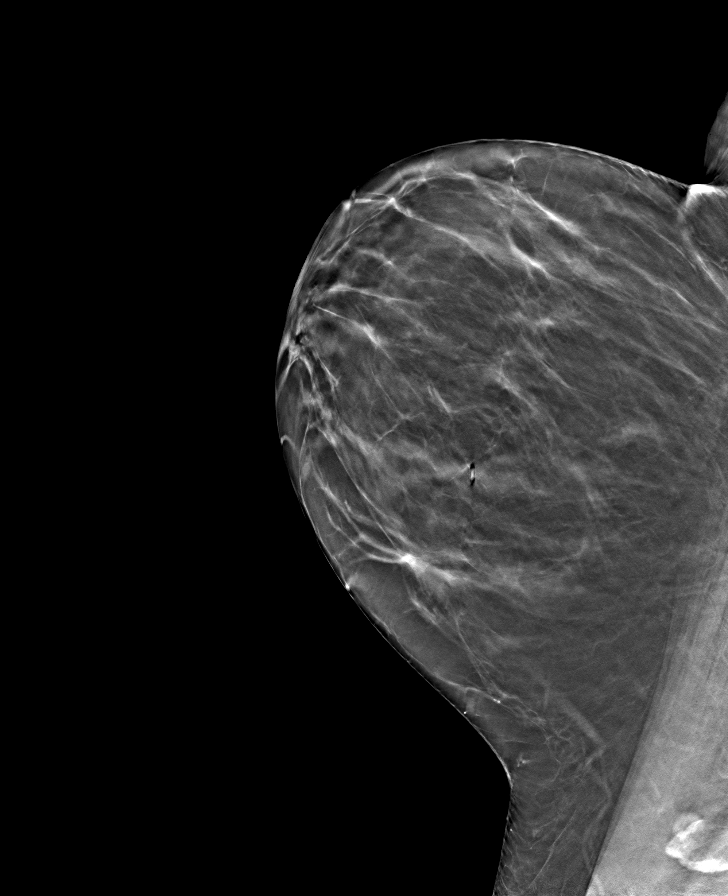

[8 of 24 positions shown; findings below may reference images not displayed]

ACR Breast Density Category b: There are scattered areas of
fibroglandular density.
FINDINGS: There are no findings suspicious for malignancy. The images were
evaluated with computer-aided detection.
IMPRESSION: No mammographic evidence of malignancy. A result letter of this
screening mammogram will be mailed directly to the patient.

RECOMMENDATION:
Screening mammogram in one year. (Code:[id number]-[id number])

BI-RADS CATEGORY  1: Negative.

## 2023-02-28 ENCOUNTER — Other Ambulatory Visit: Payer: Self-pay

## 2023-02-28 ENCOUNTER — Ambulatory Visit (INDEPENDENT_AMBULATORY_CARE_PROVIDER_SITE_OTHER): Payer: Medicare HMO | Admitting: Family Medicine

## 2023-02-28 ENCOUNTER — Encounter: Payer: Self-pay | Admitting: Family Medicine

## 2023-02-28 VITALS — BP 138/82 | HR 68 | Temp 98.0°F | Resp 16 | Ht 62.0 in | Wt 149.0 lb

## 2023-02-28 DIAGNOSIS — R2689 Other abnormalities of gait and mobility: Secondary | ICD-10-CM | POA: Diagnosis not present

## 2023-02-28 DIAGNOSIS — I700 Atherosclerosis of aorta: Secondary | ICD-10-CM

## 2023-02-28 DIAGNOSIS — F419 Anxiety disorder, unspecified: Secondary | ICD-10-CM | POA: Diagnosis not present

## 2023-02-28 DIAGNOSIS — M609 Myositis, unspecified: Secondary | ICD-10-CM | POA: Insufficient documentation

## 2023-02-28 DIAGNOSIS — T466X5A Adverse effect of antihyperlipidemic and antiarteriosclerotic drugs, initial encounter: Secondary | ICD-10-CM

## 2023-02-28 DIAGNOSIS — E7800 Pure hypercholesterolemia, unspecified: Secondary | ICD-10-CM

## 2023-02-28 LAB — LIPID PANEL
Cholesterol: 220 mg/dL — ABNORMAL HIGH (ref 0–200)
HDL: 81.8 mg/dL (ref >39.00)
LDL Cholesterol: 117 mg/dL — ABNORMAL HIGH (ref 0–99)
NonHDL: 138
Total CHOL/HDL Ratio: 3
Triglycerides: 104 mg/dL (ref 0.0–149.0)
VLDL: 20.8 mg/dL (ref 0.0–40.0)

## 2023-02-28 LAB — COMPREHENSIVE METABOLIC PANEL
ALT: 13 U/L (ref 0–35)
AST: 20 U/L (ref 0–37)
Albumin: 4.3 g/dL (ref 3.5–5.2)
Alkaline Phosphatase: 51 U/L (ref 39–117)
BUN: 13 mg/dL (ref 6–23)
CO2: 27 meq/L (ref 19–32)
Calcium: 9.2 mg/dL (ref 8.4–10.5)
Chloride: 98 meq/L (ref 96–112)
Creatinine, Ser: 0.9 mg/dL (ref 0.40–1.20)
GFR: 63.87 mL/min (ref >60.00)
Glucose, Bld: 94 mg/dL (ref 70–99)
Potassium: 4.1 meq/L (ref 3.5–5.1)
Sodium: 133 meq/L — ABNORMAL LOW (ref 135–145)
Total Bilirubin: 0.7 mg/dL (ref 0.2–1.2)
Total Protein: 6.6 g/dL (ref 6.0–8.3)

## 2023-02-28 MED ORDER — EZETIMIBE 10 MG PO TABS: 10.0000 mg | ORAL_TABLET | Freq: Every day | ORAL | 3 refills | Status: DC

## 2023-02-28 NOTE — Progress Notes (Signed)
Chief Complaint  Patient presents with   Follow-up    Follow up    Subjective: Hyperlipidemia Patient presents for Hyperlipidemia follow up. She is currently not taking anything.  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 She is usually adhering to a healthy diet. Exercise: none No CP or SOB.  The patient is known to have coexisting coronary artery disease.  GAD Taking Klonopin 0.5 mg bid prn. No AE's. Takes roughly once per day. Tried other meds and did not do well. No SI or HI. No self medication. Not following with a therapist/counselor.   Falls Mid-late summer after our most recent visit, she started having falls. Balance is poor.  No overt weakness.  She is moving to a house with no stairs because of this.  She is a current smoker.  No other neural signs or symptoms.  Past Medical History:  Diagnosis Date   10 year risk of MI or stroke 7.5% or greater 08/08/2016   Allergy    Anxiety    Aortic atherosclerosis (HCC)    Cataracts, bilateral    Coronary atherosclerosis 08/08/2016   Depression    Emphysema lung (HCC)    Osteoporosis    Tobacco abuse 08/08/2016    Objective: BP 138/82   Pulse 68   Temp 98 F (36.7 C) (Oral)   Resp 16   Ht 5\' 2"  (1.575 m)   Wt 149 lb (67.6 kg)   SpO2 99%   BMI 27.25 kg/m  General: Awake, appears stated age Heart: RRR, no LE edema, no bruits Lungs: CTAB, no rales, wheezes or rhonchi. No accessory muscle use Neuro: Gait is cautious and slowed compared to her previous baseline, grip strength is adequate, 4/5 hip flexion bilaterally, DTRs equal and symmetric throughout, no clonus Psych: Age appropriate judgment and insight, normal affect and mood  Assessment and Plan: Aortic atherosclerosis (HCC) - Plan: Comprehensive metabolic panel, Lipid panel, ezetimibe (ZETIA) 10 MG tablet  Anxiety  Statin-induced myositis  Balance problem - Plan: MR Brain Wo Contrast, Ambulatory referral to Physical Therapy  Chronic, unsure if stable.   Start Zetia 10 mg daily.  Counseled on diet and exercise. Chronic, stable.  Continue Klonopin as needed.  Update CSC. Has failed several statins. Set up with physical therapy.  Check MRI of the brain to rule out CVA/history of.  Letter given stating it would be in her best interest to have grab bars in her bathroom. F/u in 6 mo. The patient voiced understanding and agreement to the plan.  Jilda Roche [place], DO 02/28/23  11:40 AM

## 2023-02-28 NOTE — Patient Instructions (Addendum)
If you do not hear anything about your referral in the next 1-2 weeks, call our office and ask for an update.  Someone should reach out regarding your MRI.  Keep the diet clean and stay active.  Give [place] 2-3 business days to get the results of your labs back.   Let us know if you need anything.

## 2023-03-04 ENCOUNTER — Telehealth (HOSPITAL_BASED_OUTPATIENT_CLINIC_OR_DEPARTMENT_OTHER): Payer: Self-pay | Admitting: Family Medicine

## 2023-03-08 ENCOUNTER — Other Ambulatory Visit: Payer: Self-pay | Admitting: Family Medicine

## 2023-03-08 DIAGNOSIS — F419 Anxiety disorder, unspecified: Secondary | ICD-10-CM

## 2023-03-18 ENCOUNTER — Ambulatory Visit: Payer: Medicare HMO | Attending: Family Medicine | Admitting: Physical Therapy

## 2023-03-18 ENCOUNTER — Other Ambulatory Visit: Payer: Self-pay

## 2023-03-18 ENCOUNTER — Encounter: Payer: Self-pay | Admitting: Physical Therapy

## 2023-03-18 DIAGNOSIS — M6281 Muscle weakness (generalized): Secondary | ICD-10-CM

## 2023-03-18 DIAGNOSIS — R2681 Unsteadiness on feet: Secondary | ICD-10-CM

## 2023-03-18 DIAGNOSIS — M25551 Pain in right hip: Secondary | ICD-10-CM

## 2023-03-18 DIAGNOSIS — R2689 Other abnormalities of gait and mobility: Secondary | ICD-10-CM | POA: Diagnosis not present
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
## 2023-03-24 ENCOUNTER — Ambulatory Visit: Payer: Medicare HMO | Admitting: Physical Therapy

## 2023-03-24 ENCOUNTER — Encounter: Payer: Self-pay | Admitting: Physical Therapy

## 2023-03-24 DIAGNOSIS — R2689 Other abnormalities of gait and mobility: Secondary | ICD-10-CM | POA: Diagnosis not present

## 2023-03-24 DIAGNOSIS — M6281 Muscle weakness (generalized): Secondary | ICD-10-CM | POA: Diagnosis not present

## 2023-03-24 DIAGNOSIS — R2681 Unsteadiness on feet: Secondary | ICD-10-CM | POA: Diagnosis not present

## 2023-03-24 DIAGNOSIS — M25551 Pain in right hip: Secondary | ICD-10-CM | POA: Diagnosis not present
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
## 2023-03-26 ENCOUNTER — Encounter: Payer: Self-pay | Admitting: Family Medicine

## 2023-03-26 ENCOUNTER — Ambulatory Visit (HOSPITAL_BASED_OUTPATIENT_CLINIC_OR_DEPARTMENT_OTHER)
Admission: RE | Admit: 2023-03-26 | Discharge: 2023-03-26 | Disposition: A | Discharge status: Home / Self Care | Payer: Medicare HMO | Source: Ambulatory Visit | Attending: Family Medicine | Admitting: Family Medicine

## 2023-03-26 DIAGNOSIS — R2689 Other abnormalities of gait and mobility: Secondary | ICD-10-CM | POA: Insufficient documentation

## 2023-03-26 DIAGNOSIS — I6782 Cerebral ischemia: Secondary | ICD-10-CM | POA: Diagnosis not present

## 2023-03-26 DIAGNOSIS — R9089 Other abnormal findings on diagnostic imaging of central nervous system: Secondary | ICD-10-CM | POA: Diagnosis not present

## 2023-03-26 DIAGNOSIS — E785 Hyperlipidemia, unspecified: Secondary | ICD-10-CM | POA: Diagnosis not present

## 2023-04-03 ENCOUNTER — Encounter: Payer: Medicare HMO | Admitting: Physical Therapy

## 2023-04-07 ENCOUNTER — Encounter: Payer: Self-pay | Admitting: Family

## 2023-04-07 ENCOUNTER — Other Ambulatory Visit (INDEPENDENT_AMBULATORY_CARE_PROVIDER_SITE_OTHER): Payer: Medicare HMO

## 2023-04-07 DIAGNOSIS — H2513 Age-related nuclear cataract, bilateral: Secondary | ICD-10-CM | POA: Diagnosis not present

## 2023-04-07 DIAGNOSIS — E7800 Pure hypercholesterolemia, unspecified: Secondary | ICD-10-CM | POA: Diagnosis not present

## 2023-04-07 DIAGNOSIS — H25013 Cortical age-related cataract, bilateral: Secondary | ICD-10-CM | POA: Diagnosis not present

## 2023-04-07 LAB — LIPID PANEL
Cholesterol: 177 mg/dL (ref 0–200)
HDL: 82.1 mg/dL (ref >39.00)
LDL Cholesterol: 77 mg/dL (ref 0–99)
NonHDL: 94.98
Total CHOL/HDL Ratio: 2
Triglycerides: 91 mg/dL (ref 0.0–149.0)
VLDL: 18.2 mg/dL (ref 0.0–40.0)

## 2023-04-07 LAB — HEPATIC FUNCTION PANEL
ALT: 15 U/L (ref 0–35)
AST: 25 U/L (ref 0–37)
Albumin: 4.3 g/dL (ref 3.5–5.2)
Alkaline Phosphatase: 49 U/L (ref 39–117)
Bilirubin, Direct: 0.1 mg/dL (ref 0.0–0.3)
Total Bilirubin: 0.6 mg/dL (ref 0.2–1.2)
Total Protein: 6.6 g/dL (ref 6.0–8.3)

## 2023-04-10 ENCOUNTER — Encounter: Payer: Medicare HMO | Admitting: Physical Therapy

## 2023-04-17 ENCOUNTER — Encounter: Payer: Medicare HMO | Admitting: Physical Therapy

## 2023-04-21 ENCOUNTER — Encounter: Payer: Medicare HMO | Admitting: Physical Therapy

## 2023-04-28 ENCOUNTER — Encounter: Payer: Medicare HMO | Admitting: Physical Therapy

## 2023-05-07 ENCOUNTER — Telehealth: Payer: Self-pay | Admitting: Emergency Medicine

## 2023-05-07 DIAGNOSIS — F419 Anxiety disorder, unspecified: Secondary | ICD-10-CM

## 2023-05-07 MED ORDER — CLONAZEPAM 1 MG PO TABS
ORAL_TABLET | ORAL | 5 refills | Status: DC
Start: 2023-05-07 — End: 2023-11-19

## 2023-05-07 NOTE — Addendum Note (Signed)
 Addended by: Radene Gunning on: 05/07/2023 04:30 PM   Modules accepted: Orders

## 2023-05-07 NOTE — Telephone Encounter (Signed)
 Copied from CRM [id number]. Topic: Clinical - Prescription Issue >> May 07, 2023  1:08 PM Gurney Maxin H wrote: Reason for CRM: Pharmacy needs more documentation for the clonazePAM (KLONOPIN) 1 MG tablet for patient since it's a controlled substance pharmacy needs more documentation and if office has any monitoring parameters for patient.  Natalia Leatherwood [phone]

## 2023-05-12 ENCOUNTER — Telehealth: Payer: Self-pay

## 2023-05-12 NOTE — Telephone Encounter (Signed)
 Copied from CRM [id number]. Topic: Clinical - Prescription Issue >> May 12, 2023  9:41 AM Lennart Pall wrote: Reason for CRM: Patient unable to get a refill for clonazePAM (KLONOPIN) 1 MG tablet. Pharmacy needs more documentation to why she needs it. Please reach out to pharmacy as soon as possible. Patient needs the medication

## 2023-05-13 ENCOUNTER — Telehealth: Payer: Self-pay

## 2023-05-13 NOTE — Telephone Encounter (Signed)
 Pt states her pharmacy needs a VERBAL order to refill the medication, the crms states documentation was requested on 03/05 but they will fill the prescription once they receive a verbal order. The pt is out of th medication

## 2023-05-13 NOTE — Telephone Encounter (Signed)
 Spoke with a pharmacist and they just needed verification that patient is having drug screenings or signs a yearly contract for the Klonopin. Pharmacist was advised patient had a drug screen.

## 2023-05-13 NOTE — Telephone Encounter (Signed)
 Copied from CRM [id number]. Topic: Clinical - Medication Question >> May 13, 2023 11:25 AM Almira Coaster wrote: Reason for CRM: [facility] is calling for clarification on the clonazePAM (KLONOPIN) 1 MG tablet  prescription they received. Best call back number [phone].

## 2023-05-15 NOTE — Telephone Encounter (Signed)
 OK

## 2023-07-02 ENCOUNTER — Encounter: Payer: Self-pay | Admitting: Family Medicine

## 2023-07-02 ENCOUNTER — Ambulatory Visit (INDEPENDENT_AMBULATORY_CARE_PROVIDER_SITE_OTHER): Admitting: Family Medicine

## 2023-07-02 VITALS — BP 128/83 | HR 80 | Ht 62.0 in | Wt 143.0 lb

## 2023-07-02 DIAGNOSIS — M5441 Lumbago with sciatica, right side: Secondary | ICD-10-CM | POA: Diagnosis not present

## 2023-07-02 DIAGNOSIS — G8929 Other chronic pain: Secondary | ICD-10-CM | POA: Diagnosis not present

## 2023-07-02 MED ORDER — PREDNISONE 20 MG PO TABS
40.0000 mg | ORAL_TABLET | Freq: Every day | ORAL | 0 refills | Status: AC
Start: 2023-07-02 — End: 2023-07-07

## 2023-07-02 MED ORDER — TRAMADOL HCL 50 MG PO TABS
50.0000 mg | ORAL_TABLET | Freq: Three times a day (TID) | ORAL | 0 refills | Status: AC | PRN
Start: 2023-07-02 — End: 2023-07-07

## 2023-07-02 MED ORDER — CYCLOBENZAPRINE HCL 5 MG PO TABS
5.0000 mg | ORAL_TABLET | Freq: Three times a day (TID) | ORAL | 1 refills | Status: DC | PRN
Start: 2023-07-02 — End: 2023-10-22
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
## 2023-09-09 ENCOUNTER — Other Ambulatory Visit (HOSPITAL_BASED_OUTPATIENT_CLINIC_OR_DEPARTMENT_OTHER): Payer: Self-pay | Admitting: Family Medicine

## 2023-09-09 DIAGNOSIS — Z1231 Encounter for screening mammogram for malignant neoplasm of breast: Secondary | ICD-10-CM

## 2023-09-15 ENCOUNTER — Encounter (HOSPITAL_BASED_OUTPATIENT_CLINIC_OR_DEPARTMENT_OTHER): Payer: Self-pay

## 2023-09-15 ENCOUNTER — Ambulatory Visit (HOSPITAL_BASED_OUTPATIENT_CLINIC_OR_DEPARTMENT_OTHER)
Admission: RE | Admit: 2023-09-15 | Discharge: 2023-09-15 | Disposition: A | Discharge status: Home / Self Care | Source: Ambulatory Visit | Attending: Family Medicine | Admitting: Family Medicine

## 2023-09-15 DIAGNOSIS — Z1231 Encounter for screening mammogram for malignant neoplasm of breast: Secondary | ICD-10-CM | POA: Insufficient documentation

## 2023-10-22 ENCOUNTER — Encounter: Payer: Self-pay | Admitting: Family Medicine

## 2023-10-22 ENCOUNTER — Ambulatory Visit (INDEPENDENT_AMBULATORY_CARE_PROVIDER_SITE_OTHER): Admitting: Family Medicine

## 2023-10-22 VITALS — BP 118/76 | HR 83 | Temp 98.0°F | Resp 16 | Ht 62.0 in | Wt 150.6 lb

## 2023-10-22 DIAGNOSIS — I10 Essential (primary) hypertension: Secondary | ICD-10-CM

## 2023-10-22 DIAGNOSIS — E7800 Pure hypercholesterolemia, unspecified: Secondary | ICD-10-CM | POA: Diagnosis not present

## 2023-10-22 DIAGNOSIS — G8929 Other chronic pain: Secondary | ICD-10-CM

## 2023-10-22 DIAGNOSIS — T466X5A Adverse effect of antihyperlipidemic and antiarteriosclerotic drugs, initial encounter: Secondary | ICD-10-CM | POA: Diagnosis not present

## 2023-10-22 DIAGNOSIS — F419 Anxiety disorder, unspecified: Secondary | ICD-10-CM | POA: Diagnosis not present

## 2023-10-22 DIAGNOSIS — M609 Myositis, unspecified: Secondary | ICD-10-CM

## 2023-10-22 DIAGNOSIS — M5441 Lumbago with sciatica, right side: Secondary | ICD-10-CM

## 2023-10-22 DIAGNOSIS — Z79899 Other long term (current) drug therapy: Secondary | ICD-10-CM | POA: Diagnosis not present

## 2023-10-22 MED ORDER — HYDROCHLOROTHIAZIDE 25 MG PO TABS: 25.0000 mg | ORAL_TABLET | Freq: Every day | ORAL | 3 refills | Status: AC

## 2023-10-22 MED ORDER — CYCLOBENZAPRINE HCL 5 MG PO TABS
5.0000 mg | ORAL_TABLET | Freq: Three times a day (TID) | ORAL | 1 refills | Status: DC | PRN
Start: 2023-10-22 — End: 2023-12-15

## 2023-10-22 NOTE — Patient Instructions (Signed)
Give us 2-3 business days to get the results of your labs back.   Keep the diet clean and stay active.  I recommend getting the flu shot in mid October. This suggestion would change if the CDC comes out with a different recommendation.   Let us know if you need anything. 

## 2023-10-22 NOTE — Progress Notes (Signed)
 Chief Complaint  Patient presents with   Medication Refill    Medication Check    Follow-up    Follow Up    Subjective Sharon Schmidt is a 73 y.o. female who presents for hypertension follow up. She does monitor home blood pressures. Blood pressures ranging from 120's/70's on average. She is compliant with medication-  hydrochlorothiazide  25 mg/d. Patient has these side effects of medication: none She is usually adhering to a healthy diet overall. Current exercise: walking, core work No CP or SOB.   GAD Taking Klonopin  0.5-1 mg bid prn. No AE's. Works well. Failed daily meds 2/2 AE's. She is not seeing a therapist. No HI or SI. No self-medication.   Hyperlipidemia Patient presents for hyperlipidemia follow up. Currently being treated with Zetia  10 mg/d and compliance with treatment thus far has been good. She denies myalgias. Diet/exercise as above.  The patient is known to have coexisting coronary artery disease.   Past Medical History:  Diagnosis Date   10 year risk of MI or stroke 7.5% or greater 08/08/2016   Allergy    Anxiety    Aortic atherosclerosis (HCC)    Cataracts, bilateral    Coronary atherosclerosis 08/08/2016   Depression    Emphysema lung (HCC)    Osteoporosis    Tobacco abuse 08/08/2016    Exam BP 118/76 (BP Location: Left Arm, Patient Position: Sitting)   Pulse 83   Temp 98 F (36.7 C) (Oral)   Resp 16   Ht 5' 2 (1.575 m)   Wt 150 lb 9.6 oz (68.3 kg)   SpO2 97%   BMI 27.55 kg/m  General:  well developed, well nourished, in no apparent distress Heart: RRR, no bruits, no LE edema Lungs: clear to auscultation, no accessory muscle use Psych: well oriented with normal range of affect and appropriate judgment/insight  Essential hypertension  Anxiety  Pure hypercholesterolemia  High risk medication use - Plan: Drug Monitoring Panel [phone] , Urine  Chronic right-sided low back pain with right-sided sciatica - Plan: cyclobenzaprine  (FLEXERIL ) 5 MG  tablet  Statin-induced myositis  Chronic, stable.  Continue hydrochlorothiazide  25 mg daily.  Counseled on diet and exercise. Chronic, stable.  Continue Klonopin  0.5-1 mg twice daily as needed.  Update UDS and CSC today. Chronic, stable.  She is statin intolerant.  Continue Zetia  10 mg daily. As above. Continue as needed use. Has failed several statins. F/u in 6 months or as needed. The patient voiced understanding and agreement to the plan.  [name], DO 10/22/23  3:14 PM

## 2023-10-25 LAB — DRUG MONITORING PANEL 376104, URINE
Alphahydroxyalprazolam: NEGATIVE ng/mL (ref <25)
Alphahydroxymidazolam: NEGATIVE ng/mL (ref <50)
Alphahydroxytriazolam: NEGATIVE ng/mL (ref <50)
Aminoclonazepam: 290 ng/mL — ABNORMAL HIGH (ref <25)
Amphetamines: NEGATIVE ng/mL (ref <500)
Barbiturates: NEGATIVE ng/mL (ref <300)
Benzodiazepines: POSITIVE ng/mL — AB (ref <100)
Cocaine Metabolite: NEGATIVE ng/mL (ref <150)
Desmethyltramadol: NEGATIVE ng/mL (ref <100)
Hydroxyethylflurazepam: NEGATIVE ng/mL (ref <50)
Lorazepam: NEGATIVE ng/mL (ref <50)
Nordiazepam: NEGATIVE ng/mL (ref <50)
Opiates: NEGATIVE ng/mL (ref <100)
Oxazepam: NEGATIVE ng/mL (ref <50)
Oxycodone: NEGATIVE ng/mL (ref <100)
Temazepam: NEGATIVE ng/mL (ref <50)
Tramadol: NEGATIVE ng/mL (ref <100)

## 2023-10-25 LAB — DM TEMPLATE

## 2023-11-19 ENCOUNTER — Other Ambulatory Visit: Payer: Self-pay | Admitting: Family Medicine

## 2023-11-19 DIAGNOSIS — F419 Anxiety disorder, unspecified: Secondary | ICD-10-CM

## 2023-11-20 ENCOUNTER — Ambulatory Visit (INDEPENDENT_AMBULATORY_CARE_PROVIDER_SITE_OTHER): Payer: Medicare HMO | Admitting: *Deleted

## 2023-11-20 VITALS — BP 134/86 | Ht 62.0 in | Wt 150.0 lb

## 2023-11-20 DIAGNOSIS — F17210 Nicotine dependence, cigarettes, uncomplicated: Secondary | ICD-10-CM | POA: Diagnosis not present

## 2023-11-20 DIAGNOSIS — Z0000 Encounter for general adult medical examination without abnormal findings: Secondary | ICD-10-CM | POA: Diagnosis not present

## 2023-11-20 DIAGNOSIS — Z122 Encounter for screening for malignant neoplasm of respiratory organs: Secondary | ICD-10-CM

## 2023-11-20 NOTE — Progress Notes (Signed)
 Please attest this visit in the absence of patient primary care provider.    Subjective:   Sharon Schmidt is a 73 y.o. who presents for a Medicare Wellness preventive visit.  As a reminder, Annual Wellness Visits don't include a physical exam, and some assessments may be limited, especially if this visit is performed virtually. We may recommend an in-person follow-up visit with your provider if needed.  Visit Complete: Virtual I connected with  [name] on 11/20/23 by a audio enabled telemedicine application and verified that I am speaking with the correct person using two identifiers.  Patient Location: Home  Provider Location: Office/Clinic  I discussed the limitations of evaluation and management by telemedicine. The patient expressed understanding and agreed to proceed.  Vital Signs: Because this visit was a virtual/telehealth visit, some criteria may be missing or patient reported. Any vitals not documented were not able to be obtained and vitals that have been documented are patient reported.  VideoDeclined- This patient declined Librarian, academic. Therefore the visit was completed with audio only.  Persons Participating in Visit: Patient.  AWV Questionnaire: No: Patient Medicare AWV questionnaire was not completed prior to this visit.  Cardiac Risk Factors include: advanced age (>39men, >65 women);smoking/ tobacco exposure;dyslipidemia;hypertension;Other (see comment), Risk factor comments: aortic atherosclerosis, emphysema     Objective:    Today's Vitals   11/20/23 1103  BP: 134/86  Weight: 150 lb (68 kg)  Height: 5' 2 (1.575 m)   Body mass index is 27.44 kg/m.     11/20/2023   11:15 AM 03/18/2023   10:16 AM 11/19/2022   11:11 AM 11/15/2021   11:04 AM 11/13/2020   10:27 AM 12/17/2016    9:32 AM  Advanced Directives  Does Patient Have a Medical Advance Directive? Yes Yes Yes Yes Yes No   Type of Estate agent  of [employer] Power of [place];Living will Healthcare Power of [place] Living will Healthcare Power of [place];Living will   Does patient want to make changes to medical advance directive? No - Patient declined No - Patient declined No - Patient declined     Copy of Healthcare Power of Attorney in Chart? Yes - validated most recent copy scanned in chart (See row information) No - copy requested   No - copy requested      Data saved with a previous flowsheet row definition    Current Medications (verified) Outpatient Encounter Medications as of 11/20/2023  Medication Sig   aspirin EC 81 MG tablet Take 81 mg by mouth daily. Swallow whole.   Cholecalciferol (VITAMIN D  PO) Take by mouth. Take 1 gel cap by mouth daily.   clonazePAM  (KLONOPIN ) 1 MG tablet TAKE 1/2 TO 1 (ONE-HALF TO ONE) TABLET BY MOUTH TWICE DAILY AS NEEDED. DO NOT EXCEED 2 TABLETS PER DAY   cyclobenzaprine  (FLEXERIL ) 5 MG tablet Take 1 tablet (5 mg total) by mouth 3 (three) times daily as needed for muscle spasms.   diclofenac  Sodium (VOLTAREN ) 1 % GEL Apply 2 g topically 4 (four) times daily.   ezetimibe  (ZETIA ) 10 MG tablet Take 1 tablet (10 mg total) by mouth daily.   hydrochlorothiazide  (HYDRODIURIL ) 25 MG tablet Take 1 tablet (25 mg total) by mouth daily.   Magnesium 250 MG TABS Take by mouth.   Menthol, Topical Analgesic, (ICY HOT EX) Apply topically.   NON FORMULARY    OVER THE COUNTER MEDICATION Multivitamin Chewable-Take 1 chewable daily.   OVER THE COUNTER MEDICATION Antiacid-Take 1  tablet by mouth as needed.   vitamin E 400 UNIT capsule Take 800 Units by mouth daily.   No facility-administered encounter medications on file as of 11/20/2023.    Allergies (verified) Augmentin [amoxicillin-pot clavulanate], Fosamax  [alendronate ], and Statins   History: Past Medical History:  Diagnosis Date   10 year risk of MI or stroke 7.5% or greater 08/08/2016   Allergy    Anxiety    Aortic atherosclerosis (HCC)     Cataracts, bilateral    Coronary atherosclerosis 08/08/2016   Depression    Emphysema lung (HCC)    Osteoporosis    Tobacco abuse 08/08/2016   Past Surgical History:  Procedure Laterality Date   BREAST BIOPSY Left 09/24/2017   benign   CESAREAN SECTION     KNEE SURGERY     x2   OVARIAN CYST REMOVAL     TONSILLECTOMY     Family History  Problem Relation Age of Onset   Diabetes Mother    Hypertension Father    Macular degeneration Father    Vision loss Daughter    Colon cancer Neg Hx    Esophageal cancer Neg Hx    Rectal cancer Neg Hx    Stomach cancer Neg Hx    Social History   Socioeconomic History   Marital status: Married    Spouse name: Not on file   Number of children: Not on file   Years of education: Not on file   Highest education level: Not on file  Occupational History   Not on file  Tobacco Use   Smoking status: Every Day    Current packs/day: 0.75    Average packs/day: 0.8 packs/day for 47.0 years (35.2 ttl pk-yrs)    Types: Cigarettes    Start date: 07/10/1985   Smokeless tobacco: Never  Substance and Sexual Activity   Alcohol use: Yes    Comment: Social   Drug use: No   Sexual activity: Not on file  Other Topics Concern   Not on file  Social History Narrative   Not on file   Social Drivers of Health   Financial Resource Strain: Low Risk  (11/19/2022)   Overall Financial Resource Strain (CARDIA)    Difficulty of Paying Living Expenses: Not hard at all  Food Insecurity: No Food Insecurity (11/19/2022)   Hunger Vital Sign    Worried About Running Out of Food in the Last Year: Never true    Ran Out of Food in the Last Year: Never true  Transportation Needs: No Transportation Needs (11/19/2022)   PRAPARE - Administrator, Civil Service (Medical): No    Lack of Transportation (Non-Medical): No  Physical Activity: Insufficiently Active (11/19/2022)   Exercise Vital Sign    Days of Exercise per Week: 7 days    Minutes of Exercise per  Session: 20 min  Stress: No Stress Concern Present (11/19/2022)   [employer] of Occupational Health - Occupational Stress Questionnaire    Feeling of Stress : Not at all  Social Connections: Moderately Isolated (11/19/2022)   Social Connection and Isolation Panel    Frequency of Communication with Friends and Family: More than three times a week    Frequency of Social Gatherings with Friends and Family: More than three times a week    Attends Religious Services: Never    Database administrator or Organizations: No    Attends Banker Meetings: Never    Marital Status: Married    Tobacco Counseling Ready  to quit: Not Answered Counseling given: Not Answered    Clinical Intake:  Pre-visit preparation completed: Yes  Pain : No/denies pain     BMI - recorded: 27.44 Nutritional Status: BMI 25 -29 Overweight Nutritional Risks: None Diabetes: No  No results found for: HGBA1C   How often do you need to have someone help you when you read instructions, pamphlets, or other written materials from your doctor or pharmacy?: 1 - Never  Interpreter Needed?: No  Information entered by :: Deonte Otting, CMA(AAMA)   Activities of Daily Living     11/20/2023   11:09 AM  In your present state of health, do you have any difficulty performing the following activities:  Hearing? 0  Vision? 0  Difficulty concentrating or making decisions? 0  Walking or climbing stairs? 0  Dressing or bathing? 0  Doing errands, shopping? 0  Preparing Food and eating ? N  Using the Toilet? N  In the past six months, have you accidently leaked urine? N  Do you have problems with loss of bowel control? N  Managing your Medications? N  Managing your Finances? N  Housekeeping or managing your Housekeeping? N    Patient Care Team: Frann Mabel Mt, DO as PCP - General (Family Medicine) Francella Fairy SAILOR, OD (Optometry)  I have updated your Care Teams any recent Medical  Services you may have received from other providers in the past year.     Assessment:   This is a routine wellness examination for Kalen.  Hearing/Vision screen Hearing Screening - Comments:: Denies hearing difficulties.  Vision Screening - Comments:: Up to date with routine eye exams with [facility].    Goals Addressed   None    Depression Screen     10/22/2023    2:20 PM 02/28/2023   10:27 AM 11/19/2022   11:13 AM 08/26/2022   10:03 AM 11/15/2021   11:10 AM 08/22/2021    8:58 AM 11/13/2020   10:31 AM  PHQ 2/9 Scores  PHQ - 2 Score 0 0 0 0 0 0 0  PHQ- 9 Score 2 0  0  3     Fall Risk     11/20/2023   11:11 AM 10/22/2023    2:20 PM 10/22/2023    2:19 PM 02/28/2023   10:27 AM 02/28/2023   10:26 AM  Fall Risk   Falls in the past year? 1 0 0 1 0  Number falls in past yr: 0 0 0 0 0  Injury with Fall? 1 0 0 0 0  Follow up Education provided Falls evaluation completed Falls evaluation completed Falls evaluation completed Falls evaluation completed    MEDICARE RISK AT HOME:  Medicare Risk at Home Any stairs in or around the home?: Yes If so, are there any without handrails?: No Home free of loose throw rugs in walkways, pet beds, electrical cords, etc?: Yes Adequate lighting in your home to reduce risk of falls?: Yes Life alert?: No Use of a cane, walker or w/c?: Yes (has cane / walker if needed. not currently) Grab bars in the bathroom?: Yes Shower chair or bench in shower?: Yes Elevated toilet seat or a handicapped toilet?: Yes  TIMED UP AND GO:  Was the test performed?  No,audio  Cognitive Function: 6CIT completed        11/19/2022   11:18 AM 11/15/2021   11:20 AM  6CIT Screen  What Year? 0 points 0 points  What month? 0 points 0 points  What  time? 0 points 0 points  Count back from 20 0 points 0 points  Months in reverse 0 points 0 points  Repeat phrase 0 points 0 points  Total Score 0 points 0 points    Immunizations Immunization History   Administered Date(s) Administered   INFLUENZA, HIGH DOSE SEASONAL PF 12/20/2016, 12/25/2017, 12/03/2018, 12/26/2018   Moderna Sars-Covid-2 Vaccination 05/21/2019, 06/21/2019   PPD Test 06/15/2012, 07/12/2013   Pneumococcal Conjugate-13 11/08/2016   Pneumococcal Polysaccharide-23 02/15/2019   Tdap 09/10/2017   Zoster Recombinant(Shingrix ) 03/12/2017, 09/10/2017   Zoster, Live 07/10/2012    Screening Tests Health Maintenance  Topic Date Due   Lung Cancer Screening  08/22/2023   Influenza Vaccine  10/03/2023   Medicare Annual Wellness (AWV)  11/19/2023   Mammogram  09/14/2025   DTaP/Tdap/Td (2 - Td or Tdap) 09/11/2027   Colonoscopy  11/21/2027   Pneumococcal Vaccine: 50+ Years  Completed   DEXA SCAN  Completed   Hepatitis C Screening  Completed   Zoster Vaccines- Shingrix   Completed   HPV VACCINES  Aged Out   Meningococcal B Vaccine  Aged Out   COVID-19 Vaccine  Discontinued    Health Maintenance Items Addressed: Referral for lung cancer screening placed. All other HM up to date.  Additional Screening:  Vision Screening: Recommended annual ophthalmology exams for early detection of glaucoma and other disorders of the eye. Is the patient up to date with their annual eye exam?  Yes  Who is the provider or what is the name of the office in which the patient attends annual eye exams? [facility]  Dental Screening: Recommended annual dental exams for proper oral hygiene  Community Resource Referral / Chronic Care Management: CRR required this visit?  No   CCM required this visit?  No   Plan:    I have personally reviewed and noted the following in the patient's chart:   Medical and social history Use of alcohol, tobacco or illicit drugs  Current medications and supplements including opioid prescriptions. Patient is not currently taking opioid prescriptions. Functional ability and status Nutritional status Physical activity Advanced directives List of other  physicians Hospitalizations, surgeries, and ER visits in previous 12 months Vitals Screenings to include cognitive, depression, and falls Referrals and appointments  In addition, I have reviewed and discussed with patient certain preventive protocols, quality metrics, and best practice recommendations. A written personalized care plan for preventive services as well as general preventive health recommendations were provided to patient.   Lolita Libra, CMA   11/20/2023   After Visit Summary: (MyChart) Due to this being a telephonic visit, the after visit summary with patients personalized plan was offered to patient via MyChart   Notes: Nothing significant to report at this time.

## 2023-11-20 NOTE — Patient Instructions (Signed)
 Sharon Schmidt , Thank you for taking time out of your busy schedule to complete your Annual Wellness Visit with me. I enjoyed our conversation and look forward to speaking with you again next year. I, as well as your care team,  appreciate your ongoing commitment to your health goals. Please review the following plan we discussed and let me know if I can assist you in the future. Your Game plan/ To Do List    Referrals: If you haven't heard from the office you've been referred to, please reach out to them at the phone provided.   [facility] (lung cancer screening):  [phone]  MyChart IT:  [phone]   Follow up Visits: Next Medicare AWV with our clinical staff:  11/25/24 11am, telephone   Next Office Visit with your provider: 04/27/24 10am, Dr Frann  Clinician Recommendations:  Aim for 30 minutes of exercise or brisk walking, 6-8 glasses of water, and 5 servings of fruits and vegetables each day.       This is a list of the screening recommended for you and due dates:  Health Maintenance  Topic Date Due   Screening for Lung Cancer  08/22/2023   Medicare Annual Wellness Visit  11/19/2023   Flu Shot  06/01/2024*   Breast Cancer Screening  09/14/2025   DTaP/Tdap/Td vaccine (2 - Td or Tdap) 09/11/2027   Colon Cancer Screening  11/21/2027   Pneumococcal Vaccine for age over 58  Completed   DEXA scan (bone density measurement)  Completed   Hepatitis C Screening  Completed   Zoster (Shingles) Vaccine  Completed   HPV Vaccine  Aged Out   Meningitis B Vaccine  Aged Out   COVID-19 Vaccine  Discontinued  *Topic was postponed. The date shown is not the original due date.    Advanced directives: (In Chart) A copy of your advanced directives are scanned into your chart should your provider ever need it. Advance Care Planning is important because it:  [x]  Makes sure you receive the medical care that is consistent with your values, goals, and preferences  [x]  It provides  guidance to your family and loved ones and reduces their decisional burden about whether or not they are making the right decisions based on your wishes.  Follow the link provided in your after visit summary or read over the paperwork we have mailed to you to help you started getting your Advance Directives in place. If you need assistance in completing these, please reach out to us  so that we can help you!  See attachments for Preventive Care and Fall Prevention Tips.

## 2023-11-21 NOTE — Progress Notes (Signed)
 Please attest this visit in the absence of patient primary care provider.    Subjective:   Sharon Schmidt is a 73 y.o. who presents for a Medicare Wellness preventive visit.  As a reminder, Annual Wellness Visits don't include a physical exam, and some assessments may be limited, especially if this visit is performed virtually. We may recommend an in-person follow-up visit with your provider if needed.  Visit Complete: Virtual I connected with  [name] on 11/21/23 by a audio enabled telemedicine application and verified that I am speaking with the correct person using two identifiers.  Patient Location: Home  Provider Location: Office/Clinic  I discussed the limitations of evaluation and management by telemedicine. The patient expressed understanding and agreed to proceed.  Vital Signs: Because this visit was a virtual/telehealth visit, some criteria may be missing or patient reported. Any vitals not documented were not able to be obtained and vitals that have been documented are patient reported.  VideoDeclined- This patient declined Librarian, academic. Therefore the visit was completed with audio only.  Persons Participating in Visit: Patient.  AWV Questionnaire: No: Patient Medicare AWV questionnaire was not completed prior to this visit.  Cardiac Risk Factors include: advanced age (>53men, >47 women);smoking/ tobacco exposure;dyslipidemia;hypertension;Other (see comment), Risk factor comments: aortic atherosclerosis, emphysema     Objective:    Today's Vitals   11/20/23 1103  BP: 134/86  Weight: 150 lb (68 kg)  Height: 5' 2 (1.575 m)   Body mass index is 27.44 kg/m.     11/20/2023   11:15 AM 03/18/2023   10:16 AM 11/19/2022   11:11 AM 11/15/2021   11:04 AM 11/13/2020   10:27 AM 12/17/2016    9:32 AM  Advanced Directives  Does Patient Have a Medical Advance Directive? Yes Yes Yes Yes Yes No   Type of Estate agent  of [employer] Power of [place];Living will Healthcare Power of [place] Living will Healthcare Power of [place];Living will   Does patient want to make changes to medical advance directive? No - Patient declined No - Patient declined No - Patient declined     Copy of Healthcare Power of Attorney in Chart? Yes - validated most recent copy scanned in chart (See row information) No - copy requested   No - copy requested      Data saved with a previous flowsheet row definition    Current Medications (verified) Outpatient Encounter Medications as of 11/20/2023  Medication Sig   aspirin EC 81 MG tablet Take 81 mg by mouth daily. Swallow whole.   Cholecalciferol (VITAMIN D  PO) Take by mouth. Take 1 gel cap by mouth daily.   clonazePAM  (KLONOPIN ) 1 MG tablet TAKE 1/2 TO 1 (ONE-HALF TO ONE) TABLET BY MOUTH TWICE DAILY AS NEEDED. DO NOT EXCEED 2 TABLETS PER DAY   cyclobenzaprine  (FLEXERIL ) 5 MG tablet Take 1 tablet (5 mg total) by mouth 3 (three) times daily as needed for muscle spasms.   diclofenac  Sodium (VOLTAREN ) 1 % GEL Apply 2 g topically 4 (four) times daily.   ezetimibe  (ZETIA ) 10 MG tablet Take 1 tablet (10 mg total) by mouth daily.   hydrochlorothiazide  (HYDRODIURIL ) 25 MG tablet Take 1 tablet (25 mg total) by mouth daily.   Magnesium 250 MG TABS Take by mouth.   Menthol, Topical Analgesic, (ICY HOT EX) Apply topically.   NON FORMULARY    OVER THE COUNTER MEDICATION Multivitamin Chewable-Take 1 chewable daily.   OVER THE COUNTER MEDICATION Antiacid-Take 1  tablet by mouth as needed.   vitamin E 400 UNIT capsule Take 800 Units by mouth daily.   No facility-administered encounter medications on file as of 11/20/2023.    Allergies (verified) Augmentin [amoxicillin-pot clavulanate], Fosamax  [alendronate ], and Statins   History: Past Medical History:  Diagnosis Date   10 year risk of MI or stroke 7.5% or greater 08/08/2016   Allergy    Anxiety    Aortic atherosclerosis (HCC)     Cataracts, bilateral    Coronary atherosclerosis 08/08/2016   Depression    Emphysema lung (HCC)    Osteoporosis    Tobacco abuse 08/08/2016   Past Surgical History:  Procedure Laterality Date   BREAST BIOPSY Left 09/24/2017   benign   CESAREAN SECTION     KNEE SURGERY     x2   OVARIAN CYST REMOVAL     TONSILLECTOMY     Family History  Problem Relation Age of Onset   Diabetes Mother    Hypertension Father    Macular degeneration Father    Vision loss Daughter    Colon cancer Neg Hx    Esophageal cancer Neg Hx    Rectal cancer Neg Hx    Stomach cancer Neg Hx    Social History   Socioeconomic History   Marital status: Married    Spouse name: Not on file   Number of children: Not on file   Years of education: Not on file   Highest education level: Not on file  Occupational History   Not on file  Tobacco Use   Smoking status: Every Day    Current packs/day: 0.75    Average packs/day: 0.8 packs/day for 47.0 years (35.2 ttl pk-yrs)    Types: Cigarettes    Start date: 07/10/1985   Smokeless tobacco: Never  Substance and Sexual Activity   Alcohol use: Yes    Comment: Social   Drug use: No   Sexual activity: Not on file  Other Topics Concern   Not on file  Social History Narrative   Not on file   Social Drivers of Health   Financial Resource Strain: Low Risk  (11/19/2022)   Overall Financial Resource Strain (CARDIA)    Difficulty of Paying Living Expenses: Not hard at all  Food Insecurity: No Food Insecurity (11/19/2022)   Hunger Vital Sign    Worried About Running Out of Food in the Last Year: Never true    Ran Out of Food in the Last Year: Never true  Transportation Needs: No Transportation Needs (11/19/2022)   PRAPARE - Administrator, Civil Service (Medical): No    Lack of Transportation (Non-Medical): No  Physical Activity: Insufficiently Active (11/19/2022)   Exercise Vital Sign    Days of Exercise per Week: 7 days    Minutes of Exercise per  Session: 20 min  Stress: No Stress Concern Present (11/19/2022)   [employer] of Occupational Health - Occupational Stress Questionnaire    Feeling of Stress : Not at all  Social Connections: Moderately Isolated (11/19/2022)   Social Connection and Isolation Panel    Frequency of Communication with Friends and Family: More than three times a week    Frequency of Social Gatherings with Friends and Family: More than three times a week    Attends Religious Services: Never    Database administrator or Organizations: No    Attends Banker Meetings: Never    Marital Status: Married    Tobacco Counseling Ready  to quit: Not Answered Counseling given: Not Answered    Clinical Intake:  Pre-visit preparation completed: Yes  Pain : No/denies pain     BMI - recorded: 27.44 Nutritional Status: BMI 25 -29 Overweight Nutritional Risks: None Diabetes: No  No results found for: HGBA1C   How often do you need to have someone help you when you read instructions, pamphlets, or other written materials from your doctor or pharmacy?: 1 - Never  Interpreter Needed?: No  Information entered by :: Donell Sliwinski, CMA(AAMA)   Activities of Daily Living     11/20/2023   11:09 AM  In your present state of health, do you have any difficulty performing the following activities:  Hearing? 0  Vision? 0  Difficulty concentrating or making decisions? 0  Walking or climbing stairs? 0  Dressing or bathing? 0  Doing errands, shopping? 0  Preparing Food and eating ? N  Using the Toilet? N  In the past six months, have you accidently leaked urine? N  Do you have problems with loss of bowel control? N  Managing your Medications? N  Managing your Finances? N  Housekeeping or managing your Housekeeping? N    Patient Care Team: Frann Mabel Mt, DO as PCP - General (Family Medicine) Franc[facility] SAILOR, OD (Optometry)  I have updated your Care Teams any recent Medical  Services you may have received from other providers in the past year.     Assessment:   This is a routine wellness examination for Sharon Schmidt.  Hearing/Vision screen Hearing Screening - Comments:: Denies hearing difficulties.  Vision Screening - Comments:: Up to date with routine eye exams with [facility].    Goals Addressed   None    Depression Screen     10/22/2023    2:20 PM 02/28/2023   10:27 AM 11/19/2022   11:13 AM 08/26/2022   10:03 AM 11/15/2021   11:10 AM 08/22/2021    8:58 AM 11/13/2020   10:31 AM  PHQ 2/9 Scores  PHQ - 2 Score 0 0 0 0 0 0 0  PHQ- 9 Score 2 0  0  3    Pt reports no change since 10/22/23.  Fall Risk     11/20/2023   11:11 AM 10/22/2023    2:20 PM 10/22/2023    2:19 PM 02/28/2023   10:27 AM 02/28/2023   10:26 AM  Fall Risk   Falls in the past year? 1 0 0 1 0  Number falls in past yr: 0 0 0 0 0  Injury with Fall? 1 0 0 0 0  Follow up Education provided Falls evaluation completed Falls evaluation completed Falls evaluation completed Falls evaluation completed    MEDICARE RISK AT HOME:  Medicare Risk at Home Any stairs in or around the home?: Yes If so, are there any without handrails?: No Home free of loose throw rugs in walkways, pet beds, electrical cords, etc?: Yes Adequate lighting in your home to reduce risk of falls?: Yes Life alert?: No Use of a cane, walker or w/c?: Yes (has cane / walker if needed. not currently) Grab bars in the bathroom?: Yes Shower chair or bench in shower?: Yes Elevated toilet seat or a handicapped toilet?: Yes  TIMED UP AND GO:  Was the test performed?  No,audio  Cognitive Function: 6CIT completed        11/20/2023   11:15 AM 11/19/2022   11:18 AM 11/15/2021   11:20 AM  6CIT Screen  What Year? 0 points  0 points 0 points  What month? 0 points 0 points 0 points  What time? 0 points 0 points 0 points  Count back from 20 0 points 0 points 0 points  Months in reverse 0 points 0 points 0 points  Repeat phrase  0 points 0 points 0 points  Total Score 0 points 0 points 0 points    Immunizations Immunization History  Administered Date(s) Administered   INFLUENZA, HIGH DOSE SEASONAL PF 12/20/2016, 12/25/2017, 12/03/2018, 12/26/2018   Moderna Sars-Covid-2 Vaccination 05/21/2019, 06/21/2019   PPD Test 06/15/2012, 07/12/2013   Pneumococcal Conjugate-13 11/08/2016   Pneumococcal Polysaccharide-23 02/15/2019   Tdap 09/10/2017   Zoster Recombinant(Shingrix ) 03/12/2017, 09/10/2017   Zoster, Live 07/10/2012    Screening Tests Health Maintenance  Topic Date Due   Lung Cancer Screening  08/22/2023   Influenza Vaccine  06/01/2024 (Originally 10/03/2023)   Medicare Annual Wellness (AWV)  11/19/2024   Mammogram  09/14/2025   DTaP/Tdap/Td (2 - Td or Tdap) 09/11/2027   Colonoscopy  11/21/2027   Pneumococcal Vaccine: 50+ Years  Completed   DEXA SCAN  Completed   Hepatitis C Screening  Completed   Zoster Vaccines- Shingrix   Completed   HPV VACCINES  Aged Out   Meningococcal B Vaccine  Aged Out   COVID-19 Vaccine  Discontinued    Health Maintenance Items Addressed: Referral for lung cancer screening placed. All other HM up to date.  Additional Screening:  Vision Screening: Recommended annual ophthalmology exams for early detection of glaucoma and other disorders of the eye. Is the patient up to date with their annual eye exam?  Yes  Who is the provider or what is the name of the office in which the patient attends annual eye exams? [facility]  Dental Screening: Recommended annual dental exams for proper oral hygiene  Community Resource Referral / Chronic Care Management: CRR required this visit?  No   CCM required this visit?  No   Plan:    I have personally reviewed and noted the following in the patient's chart:   Medical and social history Use of alcohol, tobacco or illicit drugs  Current medications and supplements including opioid prescriptions. Patient is not currently taking  opioid prescriptions. Functional ability and status Nutritional status Physical activity Advanced directives List of other physicians Hospitalizations, surgeries, and ER visits in previous 12 months Vitals Screenings to include cognitive, depression, and falls Referrals and appointments  In addition, I have reviewed and discussed with patient certain preventive protocols, quality metrics, and best practice recommendations. A written personalized care plan for preventive services as well as general preventive health recommendations were provided to patient.   Lolita Libra, CMA   11/21/2023   After Visit Summary: (MyChart) Due to this being a telephonic visit, the after visit summary with patients personalized plan was offered to patient via MyChart   Notes: Nothing significant to report at this time.

## 2023-11-26 ENCOUNTER — Ambulatory Visit: Admitting: Family Medicine

## 2023-11-28 ENCOUNTER — Ambulatory Visit (INDEPENDENT_AMBULATORY_CARE_PROVIDER_SITE_OTHER): Admitting: Family Medicine

## 2023-11-28 ENCOUNTER — Ambulatory Visit (HOSPITAL_BASED_OUTPATIENT_CLINIC_OR_DEPARTMENT_OTHER)
Admission: RE | Admit: 2023-11-28 | Discharge: 2023-11-28 | Disposition: A | Discharge status: Home / Self Care | Source: Ambulatory Visit | Attending: Family Medicine | Admitting: Family Medicine

## 2023-11-28 ENCOUNTER — Encounter: Payer: Self-pay | Admitting: Family Medicine

## 2023-11-28 VITALS — BP 124/84 | HR 70 | Temp 98.5°F | Resp 18 | Ht 62.0 in | Wt 150.0 lb

## 2023-11-28 DIAGNOSIS — M47816 Spondylosis without myelopathy or radiculopathy, lumbar region: Secondary | ICD-10-CM | POA: Diagnosis not present

## 2023-11-28 DIAGNOSIS — M25552 Pain in left hip: Secondary | ICD-10-CM

## 2023-11-28 DIAGNOSIS — R1030 Lower abdominal pain, unspecified: Secondary | ICD-10-CM | POA: Diagnosis not present

## 2023-11-28 DIAGNOSIS — M160 Bilateral primary osteoarthritis of hip: Secondary | ICD-10-CM | POA: Diagnosis not present

## 2023-11-28 MED ORDER — METHYLPREDNISOLONE ACETATE 80 MG/ML IJ SUSP
80.0000 mg | Freq: Once | INTRAMUSCULAR | Status: AC
Start: 2023-11-28 — End: 2023-11-28
  Administered 2023-11-28: 40 mg via INTRAMUSCULAR

## 2023-11-28 MED ORDER — OXYCODONE HCL 5 MG PO TABS
5.0000 mg | ORAL_TABLET | ORAL | 0 refills | Status: AC | PRN
Start: 2023-11-28 — End: ?

## 2023-11-28 NOTE — Addendum Note (Signed)
 Addended by: DORLENE CHIQUITA RAMAN on: 11/28/2023 09:33 AM   Modules accepted: Orders

## 2023-11-28 NOTE — Patient Instructions (Addendum)
 Heat (pad or rice pillow in microwave) over affected area, 10-15 minutes twice daily.   Ice/cold pack over area for 10-15 min twice daily.  OK to take Tylenol  1000 mg (2 extra strength tabs) or 975 mg (3 regular strength tabs) every 6 hours as needed.  We will be in touch with your X-ray results.   Do not drink alcohol, do any illicit/street drugs, drive or do anything that requires alertness while on this medicine.   Let us  know if you need anything.  Hip Exercises It is normal to feel mild stretching, pulling, tightness, or discomfort as you do these exercises, but you should stop right away if you feel sudden pain or your pain gets worse.   STRETCHING AND RANGE OF MOTION EXERCISES These exercises warm up your muscles and joints and improve the movement and flexibility of your hip. These exercises also help to relieve pain, numbness, and tingling. Exercise A: Hamstrings, Supine  Lie on your back. Loop a belt or towel over the ball of your left / right foot. The ball of your foot is on the walking surface, right under your toes. Straighten your left / right knee and slowly pull on the belt to raise your leg. Do not let your left / right knee bend while you do this. Keep your other leg flat on the floor. Raise the left / right leg until you feel a gentle stretch behind your left / right knee or thigh. Hold this position for 30 seconds. Slowly return your leg to the starting position. Repeat2 times. Complete this stretch 3 times per week. Exercise B: Hip Rotators  Lie on your back on a firm surface. Hold your left / right knee with your left / right hand. Hold your ankle with your other hand. Gently pull your left / right knee and rotate your lower leg toward your other shoulder. Pull until you feel a stretch in your buttocks. Keep your hips and shoulders firmly planted while you do this stretch. Hold this position for 30 seconds. Repeat 2 times. Complete this stretch 3 times per  week. Exercise C: V-Sit (Hamstrings and Adductors)  Sit on the floor with your legs extended in a large "V" shape. Keep your knees straight during this exercise. Start with your head and chest upright, then bend at your waist to reach for your left foot (position A). You should feel a stretch in your right inner thigh. Hold this position for 30 seconds. Then slowly return to the upright position. Bend at your waist to reach forward (position B). You should feel a stretch behind both of your thighs and knees. Hold this position for 30 seconds. Then slowly return to the upright position. Bend at your waist to reach for your right foot (position C). You should feel a stretch in your left inner thigh. Hold this position for 30 seconds. Then slowly return to the upright position. Repeat A, B, and C 2 times each. Complete this stretch 3 times per week. Exercise D: Lunge (Hip Flexors)  Place your left / right knee on the floor and bend your other knee so that is directly over your ankle. You should be half-kneeling. Keep good posture with your head over your shoulders. Tighten your buttocks to point your tailbone downward. This helps your back to keep from arching too much. You should feel a gentle stretch in the front of your left / right thigh and hip. If you do not feel any resistance, slightly slide your  other foot forward and then slowly lunge forward so your knee once again lines up over your ankle. Make sure your tailbone continues to point downward. Hold this position for 30 seconds. Repeat 2 times. Complete this stretch 3 times per week.  STRENGTHENING EXERCISES These exercises build strength and endurance in your hip. Endurance is the ability to use your muscles for a long time, even after they get tired. Exercise E: Bridge (Hip Extensors)  Lie on your back on a firm surface with your knees bent and your feet flat on the floor. Tighten your buttocks muscles and lift your bottom off the  floor until the trunk of your body is level with your thighs. Do not arch your back. You should feel the muscles working in your buttocks and the back of your thighs. If you do not feel these muscles, slide your feet 1-2 inches (2.5-5 cm) farther away from your buttocks. Hold this position for 3 seconds. Slowly lower your hips to the starting position. Repeat for a total of 10 repetitions. Let your muscles relax completely between repetitions. If this exercise is too easy, try doing it with your arms crossed over your chest. Repeat 2 times. Complete this exercise 3 times per week. Exercise F: Straight Leg Raises - Hip Abductors  Lie on your side with your left / right leg in the top position. Lie so your head, shoulder, knee, and hip line up with each other. You may bend your bottom knee to help you balance. Roll your hips slightly forward, so your hips are stacked directly over each other and your left / right knee is facing forward. Leading with your heel, lift your top leg 4-6 inches (10-15 cm). You should feel the muscles in your outer hip lifting. Do not let your foot drift forward. Do not let your knee roll toward the ceiling. Hold this position for 1 second. Slowly return to the starting position. Let your muscles relax completely between repetitions. Repeat for a total of 10 repetitions.  Repeat 2 times. Complete this exercise 3 times per week. Exercise G: Straight Leg Raises - Hip Adductors  Lie on your side with your left / right leg in the bottom position. Lie so your head, shoulder, knee, and hip line up. You may place your upper foot in front to help you balance. Roll your hips slightly forward, so your hips are stacked directly over each other and your left / right knee is facing forward. Tense the muscles in your inner thigh and lift your bottom leg 4-6 inches (10-15 cm). Hold this position for 1 second. Slowly return to the starting position. Let your muscles relax  completely between repetitions. Repeat for a total of 10 repetitions. Repeat 2 times. Complete this exercise 3 times per week. Exercise H: Straight Leg Raises - Quadriceps  Lie on your back with your left / right leg extended and your other knee bent. Tense the muscles in the front of your left / right thigh. When you do this, you should see your kneecap slide up or see increased dimpling just above your knee. Tighten these muscles even more and raise your leg 4-6 inches (10-15 cm) off the floor. Hold this position for 3 seconds. Keep these muscles tense as you lower your leg. Relax the muscles slowly and completely between repetitions. Repeat for a total of 10 repetitions. Repeat 2 times. Complete this exercise 3 times per week. Exercise I: Hip Abductors, Standing Tie one end of a rubber exercise  band or tubing to a secure surface, such as a table or pole. Loop the other end of the band or tubing around your left / right ankle. Keeping your ankle with the band or tubing directly opposite of the secured end, step away until there is tension in the tubing or band. Hold onto a chair as needed for balance. Lift your left / right leg out to your side. While you do this: Keep your back upright. Keep your shoulders over your hips. Keep your toes pointing forward. Make sure to use your hip muscles to lift your leg. Do not throw your leg or tip your body to lift your leg. Hold this position for 1 second. Slowly return to the starting position. Repeat for a total of 10 repetitions. Repeat 2 times. Complete this exercise 3 times per week. Exercise J: Squats (Quadriceps) Stand in a door frame so your feet and knees are in line with the frame. You may place your hands on the frame for balance. Slowly bend your knees and lower your hips like you are going to sit in a chair. Keep your lower legs in a straight-up-and-down position. Do not let your hips go lower than your knees. Do not bend your knees  lower than told by your health care provider. If your hip pain increases, do not bend as low. Hold this position for 1 second. Slowly push with your legs to return to standing. Do not use your hands to pull yourself to standing. Repeat for a total of 10 repetitions. Repeat 2 times. Complete this exercise 3 times per week. Make sure you discuss any questions you have with your health care provider. Document Released: 03/08/2005 Document Revised: 11/13/2015 Document Reviewed: 02/13/2015 [employer] Interactive Patient Education  2018 [employer]  Iliotibial Band Syndrome Rehab It is normal to feel mild stretching, pulling, tightness, or discomfort as you do these exercises, but you should stop right away if you feel sudden pain or your pain gets worse.  Stretching and range of motion exercises These exercises warm up your muscles and joints and improve the movement and flexibility of your hip and pelvis. Exercise A: Quadriceps, prone    Lie on your abdomen on a firm surface, such as a bed or padded floor. Bend your left / right knee and hold your ankle. If you cannot reach your ankle or pant leg, loop a belt around your foot and grab the belt instead. Gently pull your heel toward your buttocks. Your knee should not slide out to the side. You should feel a stretch in the front of your thigh and knee. Hold this position for 30 seconds. Repeat 2 times. Complete this stretch 3 times per week. Exercise B: Iliotibial band    Lie on your side with your left / right leg in the top position. Bend both of your knees and grab your left / right ankle. Stretch out your bottom arm to help you balance. Slowly bring your top knee back so your thigh goes behind your trunk. Slowly lower your top leg toward the floor until you feel a gentle stretch on the outside of your left / right hip and thigh. If you do not feel a stretch and your knee will not fall farther, place the heel of your other foot on top of  your knee and pull your knee down toward the floor with your foot. Hold this position for 30 seconds. Repeat 2 times. Complete this stretch 3 times per week. Strengthening exercises  These exercises build strength and endurance in your hip and pelvis. Endurance is the ability to use your muscles for a long time, even after they get tired. Exercise C: Straight leg raises (hip abductors)     Lie on your side with your left / right leg in the top position. Lie so your head, shoulder, knee, and hip line up. You may bend your bottom knee to help you balance. Roll your hips slightly forward so your hips are stacked directly over each other and your left / right knee is facing forward. Tense the muscles in your outer thigh and lift your top leg 4-6 inches (10-15 cm). Hold this position for 3 seconds. Repeat for a total of 10 reps. Slowly return to the starting position. Let your muscles relax completely before doing another repetition. Repeat 2 times. Complete this exercise 3 times per week. Exercise D: Straight leg raises (hip extensors) Lie on your abdomen on your bed or a firm surface. You can put a pillow under your hips if that is more comfortable. Bend your left / right knee so your foot is straight up in the air. Squeeze your buttock muscles and lift your left / right thigh off the bed. Do not let your back arch. Tense this muscle as hard as you can without increasing any knee pain. Hold this position for 2 seconds. Repeat for a total of 10 reps Slowly lower your leg to the starting position and allow it to relax completely. Repeat 2 times. Complete this exercise 3 times per week. Exercise E: Hip hike Stand sideways on a bottom step. Stand on your left / right leg with your other foot unsupported next to the step. You can hold onto the railing or wall if needed for balance. Keep your knees straight and your torso square. Then, lift your left / right hip up toward the ceiling. Slowly let your  left / right hip lower toward the floor, past the starting position. Your foot should get closer to the floor. Do not lean or bend your knees. Repeat 2 times. Complete this exercise 3 times per week.  Document Released: 02/18/2005 Document Revised: 10/24/2015 Document Reviewed: 01/20/2015 Else[employer] Interactive Patient Education  [employer].

## 2023-11-28 NOTE — Progress Notes (Addendum)
 Musculoskeletal Exam  Patient: Sharon Schmidt DOB: [birth year]  DOS: 11/28/2023  SUBJECTIVE:  Chief Complaint:   Chief Complaint  Patient presents with   Fall    L hip pain --     Sharon Schmidt is a 73 y.o.  female for evaluation and treatment of L hip pain.   Onset: 3 d ago; 3.5 weeks ago she did sustain a fall tripping over a pallet [place] the [employer] [place].  Location: L outer hip Character:  sharp  Progression of issue:  is unchanged Associated symptoms: difficulty walking No bruising, redness, swelling.  Treatment: to date has been topical lidocaine, OTC NSAIDS, muscle relaxers, and tramadol .   Neurovascular symptoms: no  Past Medical History:  Diagnosis Date   10 year risk of MI or stroke 7.5% or greater 08/08/2016   Allergy    Anxiety    Aortic atherosclerosis    Cataracts, bilateral    Coronary atherosclerosis 08/08/2016   Depression    Emphysema lung (HCC)    Osteoporosis    Tobacco abuse 08/08/2016    Objective: VITAL SIGNS: BP 124/84   Pulse 70   Temp 98.5 F (36.9 C)   Resp 18   Ht 5' 2 (1.575 m)   Wt 150 lb (68 kg)   SpO2 98%   BMI 27.44 kg/m  Constitutional: Well formed, well developed. No acute distress. Thorax & Lungs: No accessory muscle use Musculoskeletal: L hip.   Tenderness to palpation: yes over greater troch and hip flexor Deformity: no Ecchymosis: no Tests positive: Stinchfield Tests negative: Log roll Neurologic: Normal sensory function. Gait antalgic.  Psychiatric: Normal mood. Age appropriate judgment and insight. Alert & oriented x 3.    Procedure note: Greater trochanteric bursa injection; done in presence of female chaperone.  Verbal consent obtained. The area of interest was palpated and demarcated with an otoscope speculum. It was cleaned with an alcohol swab. Freeze spray was used. A 27 g needle was inserted at a perpendicular angle through the area of interested. The plunger was withdrawn to ensure our placement was not in a  vessel. 2 mL of 1% lidocaine without epi and 40 mg of Depomedrol was injected. A bandaid was placed. The patient tolerated the procedure well.  There were no complications noted.   Assessment:  Left hip pain - Plan: oxyCODONE  (OXY IR/ROXICODONE ) 5 MG immediate release tablet, DG Hip Unilat W OR W/O Pelvis 2-3 Views Left, PR ARTHROCENTESIS ASPIR&/INJ MAJOR JT/BURSA W/O US   Plan: Ck XR to r/o OA. Injection today. Did well w injection on other side. Oxy prn pain. Warned about this. 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 Stretches/exercises, heat, ice, Tylenol .  F/u pending above. The patient voiced understanding and agreement to the plan.   Mabel [place], DO 11/28/23  9:31 AM

## 2023-11-28 NOTE — Addendum Note (Signed)
 Addended by: FRANN MABEL SQUIBB on: 11/28/2023 09:31 AM   Modules accepted: Orders

## 2023-12-03 ENCOUNTER — Other Ambulatory Visit: Payer: Self-pay

## 2023-12-03 DIAGNOSIS — Z122 Encounter for screening for malignant neoplasm of respiratory organs: Secondary | ICD-10-CM

## 2023-12-03 DIAGNOSIS — F17210 Nicotine dependence, cigarettes, uncomplicated: Secondary | ICD-10-CM

## 2023-12-03 DIAGNOSIS — Z87891 Personal history of nicotine dependence: Secondary | ICD-10-CM

## 2023-12-04 ENCOUNTER — Ambulatory Visit: Payer: Self-pay | Admitting: Family Medicine

## 2023-12-09 MED ORDER — METHYLPREDNISOLONE ACETATE 80 MG/ML IJ SUSP
80.0000 mg | Freq: Once | INTRAMUSCULAR | Status: AC
Start: 2023-12-09 — End: 2023-11-28
  Administered 2023-11-28: 40 mg via INTRAMUSCULAR

## 2023-12-09 NOTE — Addendum Note (Signed)
 Addended by: DORLENE CHIQUITA RAMAN on: 12/09/2023 02:36 PM   Modules accepted: Orders

## 2023-12-10 ENCOUNTER — Ambulatory Visit (HOSPITAL_BASED_OUTPATIENT_CLINIC_OR_DEPARTMENT_OTHER)
Admission: RE | Admit: 2023-12-10 | Discharge: 2023-12-10 | Disposition: A | Discharge status: Home / Self Care | Source: Ambulatory Visit | Attending: Family Medicine | Admitting: Family Medicine

## 2023-12-10 DIAGNOSIS — Z122 Encounter for screening for malignant neoplasm of respiratory organs: Secondary | ICD-10-CM | POA: Diagnosis not present

## 2023-12-10 DIAGNOSIS — Z87891 Personal history of nicotine dependence: Secondary | ICD-10-CM | POA: Insufficient documentation

## 2023-12-10 DIAGNOSIS — F17210 Nicotine dependence, cigarettes, uncomplicated: Secondary | ICD-10-CM | POA: Diagnosis not present

## 2023-12-14 ENCOUNTER — Other Ambulatory Visit: Payer: Self-pay | Admitting: Family Medicine

## 2023-12-14 DIAGNOSIS — G8929 Other chronic pain: Secondary | ICD-10-CM

## 2023-12-15 ENCOUNTER — Other Ambulatory Visit: Payer: Self-pay

## 2023-12-15 DIAGNOSIS — F17210 Nicotine dependence, cigarettes, uncomplicated: Secondary | ICD-10-CM

## 2023-12-15 DIAGNOSIS — Z122 Encounter for screening for malignant neoplasm of respiratory organs: Secondary | ICD-10-CM

## 2023-12-15 DIAGNOSIS — Z87891 Personal history of nicotine dependence: Secondary | ICD-10-CM

## 2024-01-06 DIAGNOSIS — Z85828 Personal history of other malignant neoplasm of skin: Secondary | ICD-10-CM | POA: Diagnosis not present

## 2024-01-06 DIAGNOSIS — L720 Epidermal cyst: Secondary | ICD-10-CM | POA: Diagnosis not present

## 2024-01-06 DIAGNOSIS — L820 Inflamed seborrheic keratosis: Secondary | ICD-10-CM | POA: Diagnosis not present

## 2024-01-06 DIAGNOSIS — L814 Other melanin hyperpigmentation: Secondary | ICD-10-CM | POA: Diagnosis not present

## 2024-01-06 DIAGNOSIS — L578 Other skin changes due to chronic exposure to nonionizing radiation: Secondary | ICD-10-CM | POA: Diagnosis not present

## 2024-01-06 DIAGNOSIS — D1801 Hemangioma of skin and subcutaneous tissue: Secondary | ICD-10-CM | POA: Diagnosis not present

## 2024-01-06 DIAGNOSIS — L304 Erythema intertrigo: Secondary | ICD-10-CM | POA: Diagnosis not present

## 2024-01-06 DIAGNOSIS — Z08 Encounter for follow-up examination after completed treatment for malignant neoplasm: Secondary | ICD-10-CM | POA: Diagnosis not present

## 2024-01-06 DIAGNOSIS — Z859 Personal history of malignant neoplasm, unspecified: Secondary | ICD-10-CM | POA: Diagnosis not present

## 2024-01-06 DIAGNOSIS — L821 Other seborrheic keratosis: Secondary | ICD-10-CM | POA: Diagnosis not present

## 2024-02-07 ENCOUNTER — Other Ambulatory Visit: Payer: Self-pay | Admitting: Family Medicine

## 2024-02-07 DIAGNOSIS — I700 Atherosclerosis of aorta: Secondary | ICD-10-CM

## 2024-04-27 ENCOUNTER — Encounter: Admitting: Family Medicine

## 2024-11-25 ENCOUNTER — Ambulatory Visit
